# Patient Record
Sex: Male | Born: 1950 | Race: White | Hispanic: No | Marital: Married | Smoking: Never smoker
Health system: Southern US, Community
[De-identification: ages and names within clinical notes are randomized; demographics above are authoritative.]

## PROBLEM LIST (undated history)

## (undated) DIAGNOSIS — I959 Hypotension, unspecified: Secondary | ICD-10-CM

## (undated) DIAGNOSIS — T7840XA Allergy, unspecified, initial encounter: Secondary | ICD-10-CM

## (undated) DIAGNOSIS — G4730 Sleep apnea, unspecified: Secondary | ICD-10-CM

## (undated) DIAGNOSIS — E785 Hyperlipidemia, unspecified: Secondary | ICD-10-CM

## (undated) DIAGNOSIS — I480 Paroxysmal atrial fibrillation: Secondary | ICD-10-CM

## (undated) DIAGNOSIS — I2510 Atherosclerotic heart disease of native coronary artery without angina pectoris: Secondary | ICD-10-CM

## (undated) DIAGNOSIS — H269 Unspecified cataract: Secondary | ICD-10-CM

## (undated) HISTORY — PX: COLONOSCOPY: SHX174

## (undated) HISTORY — PX: RHINOPLASTY: SUR1284

## (undated) HISTORY — PX: INGUINAL HERNIA REPAIR: SUR1180

## (undated) HISTORY — DX: Hyperlipidemia, unspecified: E78.5

## (undated) HISTORY — DX: Hypotension, unspecified: I95.9

## (undated) HISTORY — PX: MOLE REMOVAL: SHX2046

## (undated) HISTORY — DX: Paroxysmal atrial fibrillation: I48.0

## (undated) HISTORY — PX: OTHER SURGICAL HISTORY: SHX169

## (undated) HISTORY — DX: Unspecified cataract: H26.9

## (undated) HISTORY — DX: Atherosclerotic heart disease of native coronary artery without angina pectoris: I25.10

## (undated) HISTORY — DX: Allergy, unspecified, initial encounter: T78.40XA

## (undated) HISTORY — PX: POLYPECTOMY: SHX149

---

## 2002-01-15 ENCOUNTER — Encounter: Payer: Self-pay | Admitting: Gastroenterology

## 2003-12-11 ENCOUNTER — Encounter: Admission: RE | Admit: 2003-12-11 | Discharge: 2003-12-11 | Payer: Self-pay | Admitting: General Surgery

## 2003-12-12 ENCOUNTER — Ambulatory Visit (HOSPITAL_BASED_OUTPATIENT_CLINIC_OR_DEPARTMENT_OTHER): Admission: RE | Admit: 2003-12-12 | Discharge: 2003-12-12 | Payer: Self-pay | Admitting: General Surgery

## 2003-12-12 ENCOUNTER — Ambulatory Visit (HOSPITAL_COMMUNITY): Admission: RE | Admit: 2003-12-12 | Discharge: 2003-12-12 | Payer: Self-pay | Admitting: General Surgery

## 2007-05-24 ENCOUNTER — Ambulatory Visit: Payer: Self-pay | Admitting: Gastroenterology

## 2007-06-07 ENCOUNTER — Encounter: Payer: Self-pay | Admitting: Gastroenterology

## 2007-06-07 ENCOUNTER — Ambulatory Visit: Payer: Self-pay | Admitting: Gastroenterology

## 2010-02-04 ENCOUNTER — Encounter: Payer: Self-pay | Admitting: Gastroenterology

## 2010-02-09 ENCOUNTER — Encounter (INDEPENDENT_AMBULATORY_CARE_PROVIDER_SITE_OTHER): Payer: Self-pay | Admitting: *Deleted

## 2010-02-09 ENCOUNTER — Encounter: Payer: Self-pay | Admitting: Gastroenterology

## 2010-04-06 ENCOUNTER — Ambulatory Visit: Payer: Self-pay | Admitting: Gastroenterology

## 2010-04-06 DIAGNOSIS — Z860100 Personal history of colon polyps, unspecified: Secondary | ICD-10-CM | POA: Insufficient documentation

## 2010-04-06 DIAGNOSIS — K921 Melena: Secondary | ICD-10-CM

## 2010-09-17 NOTE — Letter (Signed)
Summary: New Patient letter  [facility]  [address] [place], [place] [address]   Phone: [phone]  Fax: [phone]       02/09/2010 MRN: [id number]  Ronald Davis [address] [place], [place]  [address]  Dear Ronald Davis,  Welcome to the Gastroenterology Division at [facility].    You are scheduled to see Dr.  Russella Dar on 04-06-10 at 2:30p.m. on the 3rd floor at [facility], [address]  We ask that you try to arrive at our office 15 minutes prior to your appointment time to allow for check-in.  We would like you to complete the enclosed self-administered evaluation form prior to your visit and bring it with you on the day of your appointment.  We will review it with you.  Also, please bring a complete list of all your medications or, if you prefer, bring the medication bottles and we will list them.  Please bring your insurance card so that we may make a copy of it.  If your insurance requires a referral to see a specialist, please bring your referral form from your primary care physician.  Co-payments are due at the time of your visit and may be paid by cash, check or credit card.     Your office visit will consist of a consult with your physician (includes a physical exam), any laboratory testing he/she may order, scheduling of any necessary diagnostic testing (e.g. x-ray, ultrasound, CT-scan), and scheduling of a procedure (e.g. Endoscopy, Colonoscopy) if required.  Please allow enough time on your schedule to allow for any/all of these possibilities.    If you cannot keep your appointment, please call [phone] to cancel or reschedule prior to your appointment date.  This allows [place] the opportunity to schedule an appointment for another patient in need of care.  If you do not cancel or reschedule by 5 p.m. the business day prior to your appointment date, you will be charged a $50.00 late cancellation/no-show fee.    Thank you for choosing  [facility] for your medical needs.  We appreciate the opportunity to care for you.  Please visit [place] at our website  to learn more about our practice.                     Sincerely,                                                             The Gastroenterology Division

## 2010-09-17 NOTE — Assessment & Plan Note (Signed)
Summary: f/u--ch.   History of Present Illness Visit Type: follow up Primary GI MD: Elie Goody MD [facility] Primary Provider: Richmond Campbell, PA  Requesting Provider: Richmond Campbell, PA  Chief Complaint: Hemorrhoids, diarrhea, and rectal bleeding  History of Present Illness:   Mr. Vallone reports intermittent episodes of scant amounts of bright red blood per rectum. He attributes this to hemorrhoids.  He notes loose stools when he drinks more than a 2 cups of coffee per day.  He had a maternal grandmother with colon cancer in her 10s and he has had prior adenomatous colon polyps found in 05/2007.   GI Review of Systems      Denies abdominal pain, acid reflux, belching, bloating, chest pain, dysphagia with liquids, dysphagia with solids, heartburn, loss of appetite, nausea, vomiting, vomiting blood, weight loss, and  weight gain.      Reports diarrhea, hemorrhoids, and  rectal bleeding.     Denies anal fissure, black tarry stools, change in bowel habit, constipation, diverticulosis, fecal incontinence, heme positive stool, irritable bowel syndrome, jaundice, light color stool, liver problems, and  rectal pain.   Current Medications (verified): 1)  Clobetasol Propionate 0.05 % Crea (Clobetasol Propionate) .... Apply To Affected Area As Needed 2)  Fexofenadine Hcl 180 Mg Tabs (Fexofenadine Hcl) .... One Tablet By Mouth Once Daily As Needed Around Spring Time 3)  Ibuprofen 200 Mg Tabs (Ibuprofen) .... Two Tablets By Mouth As Needed For Headaches 4)  Aspirin 81 Mg Chew (Aspirin) .... Two Tablets By Mouth As Needed For Headaches If No  Ibuprofen On Hand  Allergies (verified): 1)  ! 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 Right inguinal hernia repair Bilateral ear  surgery Rhinoplasty Tonsillectomy  Family History: Reviewed history from 04/02/2010 and no changes required. Family History of Colon Cancer:Maternal Grandmother at onset age 63's. Family History of Heart Disease: Father  Social History: Chemist  Married No childern Patient has never smoked.  Alcohol Use - yes: 2 glass of wine daily  Daily Caffeine Use: 2 cups daily  Illicit Drug Use - no Drug Use:  no  Review of Systems       The patient complains of allergy/sinus.         The pertinent positives and negatives are noted as above and in the HPI. All other ROS were reviewed and were negative.   Vital Signs:  Patient profile:   60 year old male Height:      73 inches Weight:      251 pounds BMI:     33.24 BSA:     2.37 Pulse rate:   72 / minute Pulse rhythm:   regular BP sitting:   120 / 68  (left arm) Cuff size:   regular  Vitals Entered By: Ok Anis CMA (April 06, 2010 1:53 PM)   Physical Exam  General:  Well developed, well nourished, no acute distress. Head:  Normocephalic and atraumatic. Eyes:  PERRLA, no icterus. Ears:  Normal auditory acuity. Mouth:  No deformity or lesions, dentition normal. Lungs:  Clear throughout to auscultation. Heart:  Regular rate and rhythm; no murmurs, rubs,  or bruits. Abdomen:  Soft, nontender and nondistended. No masses, hepatosplenomegaly or hernias noted. Normal bowel sounds. Rectal:  deferred until time of colonoscopy.   Pulses:  Normal pulses noted. Extremities:  No clubbing, cyanosis, edema or  deformities noted. Neurologic:  Alert and  oriented x4;  grossly normal neurologically. Psych:  Alert and cooperative. Normal mood and affect.  Impression & Recommendations:  Problem # 1:  BLOOD IN STOOL (ICD-578.1) Intermittent small-volume hematochezia likely attributable to hemorrhoids. Given his prior history of adenomatous colon polyps and family history of colon cancer, plan to proceed with colonoscopy to exclude other  etiologies. The risks, benefits and alternatives to colonoscopy with possible biopsy, possible destruction of hemorrhoids and possible polypectomy were discussed with the patient and they consent to proceed. The procedure will be scheduled electively.  Problem # 2:  PERSONAL HISTORY OF COLONIC POLYPS (ICD-V12.72) Adenomatous colon polyps, initially diagnosed in October 2008. See problem #1.  Problem # 3:  FM HX MALIGNANT NEOPLASM GASTROINTESTINAL TRACT (ICD-V16.0) See problem #1.  Patient Instructions: 1)  Colonoscopy brochure given.  2)  Copy sent to : Richmond Campbell, PA 3)  The medication list was reviewed and reconciled.  All changed / newly prescribed medications were explained.  A complete medication list was provided to the patient / caregiver.  Prescriptions: MOVIPREP 100 GM  SOLR (PEG-KCL-NACL-NASULF-NA ASC-C) As per prep instructions.  #1 x 0   Entered by:   Christie Nottingham CMA (AAMA)   Authorized by:   Meryl Dare MD [facility]   Signed by:   Meryl Dare MD [facility] on 04/06/2010   Method used:   Electronically to        [facility] [place] [address]* (retail)       [address] [place] [address] [place], [place]  [address]       Ph: [phone]       Fax: [phone]   RxID:   [id number]

## 2010-09-17 NOTE — Letter (Signed)
[facility]   Imported By: Sherian Rein 04/09/2010 10:38:10  _____________________________________________________________________  External Attachment:    Type:   Image     Comment:   External Document

## 2010-09-17 NOTE — Procedures (Signed)
Summary: Colonoscopy   Colonoscopy  Procedure date:  01/15/2002  Findings:      Results: Hemorrhoids.     Results: Diverticulosis.       Pathology:  Hyperplastic polyp.     Location:  [facility].    Procedures Next Due Date:    Colonoscopy: 01/2005 Patient Name: Mohd., Derflinger MRN:  Procedure Procedures: Colonoscopy CPT: 04540.    with Hot Biopsy(s)CPT: Z451292.  Personnel: Endoscopist: Venita Lick. Russella Dar, MD, Clementeen Graham.  Referred By: Barbera Setters Artis Flock, MD.  Exam Location: Exam performed in Outpatient Clinic. Outpatient  Patient Consent: Procedure, Alternatives, Risks and Benefits discussed, consent obtained, from patient. Consent was obtained by the RN.  Indications  Increased Risk Screening: For family history of colorectal neoplasia, in  grandparent age at onset: 49s.  Comments: MGM with colon ca History  Pre-Exam Physical: Performed Jan 15, 2002. Cardio-pulmonary exam, Rectal exam, Abdominal exam, Neurological exam, Mental status exam WNL.  Exam Exam: Extent of exam reached: Cecum, extent intended: Cecum.  The cecum was identified by appendiceal orifice and IC valve. Colon retroflexion performed. ASA Classification: I. Tolerance: good.  Monitoring: Pulse and BP monitoring, Oximetry used. Supplemental O2 given.  Colon Prep Used Golytely for colon prep. Prep results: good.  Sedation Meds: Patient assessed and found to be appropriate for moderate (conscious) sedation. Fentanyl 100 mcg. given IV. Versed 5 mg. given IV.  Findings - DIVERTICULOSIS: Sigmoid Colon. Not bleeding. ICD9: Diverticulosis: 562.10.  NORMAL EXAM: Cecum to Descending Colon.  MULTIPLE POLYPS: Sigmoid Colon to Rectum. minimum size 2 mm, maximum size 4 mm. Procedure:  hot biopsy, removed, Polyp retrieved, 4 polyps Polyps sent to pathology. ICD9: Colon Polyps: 211.3.  HEMORRHOIDS: Internal. Size: Small. Not bleeding. Not thrombosed. ICD9: Hemorrhoids, Internal: 455.0.    Assessment  Diagnoses: 562.10: Diverticulosis.  211.3: Colon Polyps.  455.0: Hemorrhoids, Internal.   Events  Unplanned Interventions: No intervention was required.  Unplanned Events: There were no complications. Plans  Post Exam Instructions: No aspirin or non-steroidal containing medications: 2 weeks.  Medication Plan: Await pathology. Continue current medications.  Patient Education: Patient given standard instructions for: Polyps. Diverticulosis. Hemorrhoids.  Disposition: After procedure patient sent to recovery. After recovery patient sent home.  Scheduling/Referral: Colonoscopy, to [facility] T. Russella Dar, MD, [facility], if polyp(s) adenomatous, around Jan 15, 2005.  Primary Care Justyn Boyson, to Fayrene Fearing B. Kindl, MD,    This report was created from the original endoscopy report, which was reviewed and signed by the above listed endoscopist.    cc: Fayrene Fearing B. Artis Flock, MD

## 2010-09-17 NOTE — Letter (Signed)
Summary: [facility] Instructions  [facility]  [address] [place], [place] [address]   Phone: [phone]  Fax: [phone]       STRIDER VALLANCE    February 06, 1951    MRN: [id number]        Procedure Day /Date: Friday August 26th, 2011     Arrival Time: 9:00am     Procedure Time: 10:00am     Location of Procedure:                    _ x_  [facility] (4th Floor)                        PREPARATION FOR COLONOSCOPY WITH MOVIPREP   Starting 5 days prior to your procedure 04/05/10 do not eat nuts, seeds, popcorn, corn, beans, peas,  salads, or any raw vegetables.  Do not take any fiber supplements (e.g. Metamucil, Citrucel, and Benefiber).  THE DAY BEFORE YOUR PROCEDURE         DATE: 04/09/10  DAY: Thursday  1.  Drink clear liquids the entire day-NO SOLID FOOD  2.  Do not drink anything colored red or purple.  Avoid juices with pulp.  No orange juice.  3.  Drink at least 64 oz. (8 glasses) of fluid/clear liquids during the day to prevent dehydration and help the prep work efficiently.  CLEAR LIQUIDS INCLUDE: Water Jello Ice Popsicles Tea (sugar ok, no milk/cream) Powdered fruit flavored drinks Coffee (sugar ok, no milk/cream) Gatorade Juice: apple, white grape, white cranberry  Lemonade Clear bullion, consomm, broth Carbonated beverages (any kind) Strained chicken noodle soup Hard Candy                             4.  In the morning, mix first dose of MoviPrep solution:    Empty 1 Pouch A and 1 Pouch B into the disposable container    Add lukewarm drinking water to the top line of the container. Mix to dissolve    Refrigerate (mixed solution should be used within 24 hrs)  5.  Begin drinking the prep at 5:00 p.m. The MoviPrep container is divided by 4 marks.   Every 15 minutes drink the solution down to the next mark (approximately 8 oz) until the full liter is complete.   6.  Follow completed prep with 16 oz of clear liquid of your choice  (Nothing red or purple).  Continue to drink clear liquids until bedtime.  7.  Before going to bed, mix second dose of MoviPrep solution:    Empty 1 Pouch A and 1 Pouch B into the disposable container    Add lukewarm drinking water to the top line of the container. Mix to dissolve    Refrigerate  THE DAY OF YOUR PROCEDURE      DATE: 04/10/10 DAY: Friday  Beginning at 5:00 a.m. (5 hours before procedure):         1. Every 15 minutes, drink the solution down to the next mark (approx 8 oz) until the full liter is complete.  2. Follow completed prep with 16 oz. of clear liquid of your choice.    3. You may drink clear liquids until 8:00am  (2 HOURS BEFORE PROCEDURE).   MEDICATION INSTRUCTIONS  Unless otherwise instructed, you should take regular prescription medications with a small sip of water   as early as possible the morning of  your procedure.        OTHER INSTRUCTIONS  You will need a responsible adult at least 62 years of age to accompany you and drive you home.   This person must remain in the waiting room during your procedure.  Wear loose fitting clothing that is easily removed.  Leave jewelry and other valuables at home.  However, you may wish to bring a book to read or  an iPod/MP3 player to listen to music as you wait for your procedure to start.  Remove all body piercing jewelry and leave at home.  Total time from sign-in until discharge is approximately 2-3 hours.  You should go home directly after your procedure and rest.  You can resume normal activities the  day after your procedure.  The day of your procedure you should not:   Drive   Make legal decisions   Operate machinery   Drink alcohol   Return to work  You will receive specific instructions about eating, activities and medications before you leave.    The above instructions have been reviewed and explained to me by   Estill Bamberg.     I fully understand and can verbalize these  instructions _____________________________ Date _________

## 2010-09-17 NOTE — Procedures (Signed)
Summary: Colonoscopy and biopsy   Colonoscopy  Procedure date:  06/07/2007  Findings:      Results: Hemorrhoids.     Results: Diverticulosis.       Pathology:  Adenomatous polyp.        Location:  [facility].    Procedures Next Due Date:    Colonoscopy: 06/2012 Patient Name: Ronald Davis, Ronald Davis MRN:  Procedure Procedures: Colonoscopy CPT: 75916.    with biopsy. CPT: Q5068410.    with polypectomy. CPT: A3573898.  Personnel: Endoscopist: Venita Lick. Russella Dar, MD, Clementeen Graham.  Exam Location: Exam performed in Outpatient Clinic. Outpatient  Patient Consent: Procedure, Alternatives, Risks and Benefits discussed, consent obtained, from patient. Consent was obtained by the RN.  Indications  Increased Risk Screening: For family history of colorectal neoplasia, in  grandparent age at onset: 35s. Family History of Polyps.  Comments: MGM with colon cancer and all 6 of his siblings have colon polyps. History  Current Medications: Patient is not currently taking Coumadin.  Pre-Exam Physical: Performed Jun 07, 2007. Cardio-pulmonary exam, Rectal exam, HEENT exam , Abdominal exam, Mental status exam WNL.  Comments: Pt. history reviewed/updated, physical exam performed prior to initiation of sedation?Yes Exam Exam: Extent of exam reached: Cecum, extent intended: Cecum.  The cecum was identified by appendiceal orifice and IC valve. Time to Cecum: 00:02: 43. Time for Withdrawl: 00:13:08. Colon retroflexion performed. ASA Classification: II. Tolerance: excellent.  Monitoring: Pulse and BP monitoring, Oximetry used. Supplemental O2 given.  Colon Prep Used MoviPrep for colon prep. Prep results: good.  Sedation Meds: Patient assessed and found to be appropriate for moderate (conscious) sedation. Fentanyl 50 mcg. given IV. Versed 5 mg. given IV.  Findings POLYP: Descending Colon, Maximum size: 3 mm. sessile polyp. Procedure:  biopsy without cautery, removed, retrieved, Polyp sent to  pathology. ICD9: Colon Polyps: 211.3.  - DIVERTICULOSIS: Descending Colon to Sigmoid Colon. Not bleeding. ICD9: Diverticulosis: 562.10.  NORMAL EXAM: Cecum to Splenic Flexure.  POLYP: Rectum, Maximum size: 5 mm. sessile polyp. Procedure:  snare without cautery, removed, retrieved, sent to pathology. ICD9: Colon Polyps: 211.3.  HEMORRHOIDS: Internal. Size: Small. Not bleeding. Not thrombosed. ICD9: Hemorrhoids, Internal: 455.0.   Assessment  Diagnoses: 211.3: Colon Polyps.  562.10: Diverticulosis.  455.0: Hemorrhoids, Internal.   Events  Unplanned Interventions: No intervention was required.  Unplanned Events: There were no complications. Plans  Post Exam Instructions: Post sedation instructions given.  Medication Plan: Await pathology.  Patient Education: Patient given standard instructions for: Polyps. Diverticulosis. Hemorrhoids.  Disposition: After procedure patient sent to recovery. After recovery patient sent home.  Scheduling/Referral: Colonoscopy, to [facility] T. Russella Dar, MD, [facility], around Jun 06, 2012.    This report was created from the original endoscopy report, which was reviewed and signed by the above listed endoscopist.    cc: Fayrene Fearing B. Artis Flock, MD       SP-Surgical Pathology - STATUS: Final  .          By: Windy Kalata,      Perform Date: 22Oct08 00:01  Ordered By: Rica Records Date: 23Oct08 13:59  Facility: [facility]                               Department: CPATH  Service Report Text  [facility]   [address] [address]   [place], [place] [address]   Telephone [phone] or [phone] Fax [phone]    REPORT OF SURGICAL PATHOLOGY    Case #: [id number]   Patient Name: Ronald Davis, Ronald Davis.   Office Chart Number: [id number]    MRN: [id number]   Pathologist: Alden Server A. Delila Spence, MD   DOB/Age [birth year] (Age: 60) Gender: M   Date Taken: 06/07/2007   Date Received: 06/08/2007    FINAL DIAGNOSIS     ***MICROSCOPIC EXAMINATION AND DIAGNOSIS***    COLON, DESCENDING AND RECTUM, POLYPS: ONE ADENOMATOUS POLYP AND   TWO FRAGMENTS OF HYPERPLASTIC POLYPS.    mj   Date Reported: 06/09/2007 Alden Server A. Delila Spence, MD   *** Electronically Signed Out By [facility] ***    Clinical information   Family history Ca; screening. R/O adenoma (kv)    specimen(s) obtained   Colon, polyp(s), descending and rectal    Gross Description   Received in formalin are tan, soft tissue fragments that are   submitted in toto. Number: two.   Size: 0.5 cm. (SP:gt, 06/08/07)    gdt/   Additional Information  HL7 RESULT STATUS : F  External IF Update Timestamp : 2007-06-08:14:02:00.000000

## 2010-12-03 ENCOUNTER — Telehealth: Payer: Self-pay | Admitting: Gastroenterology

## 2010-12-03 NOTE — Telephone Encounter (Signed)
i spoke 

## 2010-12-03 NOTE — Telephone Encounter (Signed)
I spoke with the patient's wife and she is asking that the rectal bleeding be removed from her husbands chart.  I have advised the patient's wife this can't be removed, but I could send her a copy of the records to provide to the insurance company as Dr Ardell Isaacs office note says rectal bleeding is most likely hemorrhoidal. She will come pick up the letters today.

## 2010-12-14 ENCOUNTER — Ambulatory Visit (AMBULATORY_SURGERY_CENTER): Payer: BC Managed Care – PPO | Admitting: *Deleted

## 2010-12-14 VITALS — Ht 74.0 in | Wt 248.6 lb

## 2010-12-14 DIAGNOSIS — Z86010 Personal history of colonic polyps: Secondary | ICD-10-CM

## 2010-12-14 MED ORDER — PEG-KCL-NACL-NASULF-NA ASC-C 100 G PO SOLR: 1.0000 | Freq: Once | ORAL | Status: AC

## 2010-12-16 ENCOUNTER — Encounter: Payer: Self-pay | Admitting: Gastroenterology

## 2011-01-01 NOTE — Op Note (Signed)
NAME:  Ronald Davis, Ronald Davis NO.:  [id number]   MEDICAL RECORD NO.:  [id number]                   PATIENT TYPE:  AMB   LOCATION:  [facility]                                  FACILITY:  [facility]   PHYSICIAN:  Ollen Gross. Vernell Morgans, M.D.              DATE OF BIRTH:  [birth year]   DATE OF PROCEDURE:  12/16/2003  DATE OF DISCHARGE:  12/12/2003                                 OPERATIVE REPORT   PREOPERATIVE DIAGNOSIS:  Right inguinal hernia.   POSTOPERATIVE DIAGNOSIS:  Right inguinal hernia.   PROCEDURE:  Right direct inguinal hernia repair with mesh.   SURGEON:  Ollen Gross. Carolynne Edouard, M.D.   ANESTHESIA:  General via LMA.   DESCRIPTION OF PROCEDURE:  After informed consent was obtained, the patient  was brought to the operating room and placed in the supine position on the  operating table.  After the adequate induction of general anesthesia, the  patient's right abdomen and groin were prepped with Betadine and draped in  the usual sterile manner.  An incision was made from the edge of the pubic  tubercle on the right towards the anterior iliac spine for a distance of  about 5 cm.  This incision was carried down through the skin and  subcutaneous tissue sharply.  Using the Bovie electrocautery, a small  bridging vein was encountered which was clamped with hemostats, divided and  ligated with 3-0 silk ties.  The rest of the dissection was continued deeply  with the electrocautery until the fascia of the external oblique was  encountered.  The fascia of the external oblique was opened along its fibers  before safely exiting the external ring.  This was done sharply with the  Metzenbaum scissors.  Care was taken to avoid the ilioinguinal nerve  underneath.  Once this was accomplished, Weitlaner retractor was used to  retract the fascia laterally and medially.  The blunt dissection was carried  out around the cord structures to the edge of the pubic tubercle then using  blunt finger  dissection until the cord structures could be surrounded  between two fingers.  A 1/2 inch Penrose drain was then placed around the  cord structure for retraction purposes.  The cord was skeletonized bluntly  with the hemostat and DeBakey.  There was no indirect component identified.  There was some weakness of the floor of the inguinal canal consistent with a  direct inguinal hernia.  It was very broad based.  The hernia sac was  reduced back below the transversalis.  The transversalis layer was then  repaired using an interrupted 2-0 Vicryl stitches.  The floor of the canal  was then reinforced with a piece of 3 x 6 Prolene mesh.  Tails were cut in  the mesh laterally and wrapped around the cord structures.  The mesh was  then sewn inferiorly to the shelving edge of the inguinal ligament  using a  running 3-0 Prolene stitch.  The mesh was then sewed superiorly to the  muscular epidermotic strength layer of the transversalis using interrupted 2-  0 Prolene vertical mattress stitches.  The tails were wrapped around the  cord structures and anchored laterally with an interrupted 2-0 Prolene  stitch to the shelving edge of the inguinal ligament.  Once this was  accomplished, the mesh was in good position without any tension.  The repair  look very good.  The wound was then irrigated with copious amounts of  saline.  The external oblique fascia was then reapproximated using a running  2-0 Vicryl stitch.  The wound was infiltrated with 0.25% Marcaine, and the  subcutaneous fascia was closed with a running 3-0 Vicryl stitch.  The skin  was closed with a running 4-0 Monocryl subcuticular stitch.  Benzoin, Steri-Strips and sterile dressings were applied.  The patient  tolerated the procedure well.  At the end of the case, all needle, sponge  and instrument counts were correct.  The patient was awakened and taken to  the recovery room in stable condition.                                                Ollen Gross. Vernell Morgans, M.D.    PST/MEDQ  D:  12/16/2003  T:  12/16/2003  Job:  [id number]

## 2011-01-04 ENCOUNTER — Encounter: Payer: Self-pay | Admitting: Gastroenterology

## 2011-01-04 DIAGNOSIS — K5730 Diverticulosis of large intestine without perforation or abscess without bleeding: Secondary | ICD-10-CM | POA: Insufficient documentation

## 2011-01-05 ENCOUNTER — Encounter: Payer: Self-pay | Admitting: Gastroenterology

## 2011-01-05 ENCOUNTER — Ambulatory Visit (AMBULATORY_SURGERY_CENTER): Payer: BC Managed Care – PPO | Admitting: Gastroenterology

## 2011-01-05 VITALS — BP 147/88 | HR 80 | Temp 97.5°F | Resp 18 | Ht 74.0 in | Wt 248.0 lb

## 2011-01-05 DIAGNOSIS — D126 Benign neoplasm of colon, unspecified: Secondary | ICD-10-CM

## 2011-01-05 DIAGNOSIS — Z86010 Personal history of colonic polyps: Secondary | ICD-10-CM

## 2011-01-05 DIAGNOSIS — K921 Melena: Secondary | ICD-10-CM

## 2011-01-05 DIAGNOSIS — Z800 Family history of malignant neoplasm of digestive organs: Secondary | ICD-10-CM

## 2011-01-05 MED ORDER — SODIUM CHLORIDE 0.9 % IV SOLN: 500.0000 mL | INTRAVENOUS | Status: DC

## 2011-01-05 NOTE — Patient Instructions (Signed)
Polyps removed today. Diverticulosis warrants a high fiber diet with liberal fluid intake, just not today. Today follow blue and green discharge instruction sheets. Await pathology.  Dr. Russella Dar will mail you the results of pathology on polyps. Repeat colonoscopy in 5 years.

## 2011-01-06 ENCOUNTER — Telehealth: Payer: Self-pay | Admitting: *Deleted

## 2011-01-06 NOTE — Telephone Encounter (Signed)

## 2011-01-13 ENCOUNTER — Encounter: Payer: Self-pay | Admitting: Gastroenterology

## 2013-12-02 ENCOUNTER — Emergency Department (HOSPITAL_COMMUNITY)
Admission: EM | Admit: 2013-12-02 | Discharge: 2013-12-02 | Disposition: A | Discharge status: Home / Self Care | Payer: BC Managed Care – PPO | Attending: Emergency Medicine | Admitting: Emergency Medicine

## 2013-12-02 ENCOUNTER — Encounter (HOSPITAL_COMMUNITY): Payer: Self-pay | Admitting: Emergency Medicine

## 2013-12-02 ENCOUNTER — Emergency Department (HOSPITAL_COMMUNITY): Payer: BC Managed Care – PPO

## 2013-12-02 DIAGNOSIS — R079 Chest pain, unspecified: Secondary | ICD-10-CM | POA: Insufficient documentation

## 2013-12-02 DIAGNOSIS — Z880 Allergy status to penicillin: Secondary | ICD-10-CM | POA: Insufficient documentation

## 2013-12-02 DIAGNOSIS — Z79899 Other long term (current) drug therapy: Secondary | ICD-10-CM | POA: Insufficient documentation

## 2013-12-02 DIAGNOSIS — Z791 Long term (current) use of non-steroidal anti-inflammatories (NSAID): Secondary | ICD-10-CM | POA: Insufficient documentation

## 2013-12-02 DIAGNOSIS — R0602 Shortness of breath: Secondary | ICD-10-CM | POA: Insufficient documentation

## 2013-12-02 DIAGNOSIS — Z8669 Personal history of other diseases of the nervous system and sense organs: Secondary | ICD-10-CM | POA: Insufficient documentation

## 2013-12-02 DIAGNOSIS — IMO0002 Reserved for concepts with insufficient information to code with codable children: Secondary | ICD-10-CM | POA: Insufficient documentation

## 2013-12-02 DIAGNOSIS — Z7982 Long term (current) use of aspirin: Secondary | ICD-10-CM | POA: Insufficient documentation

## 2013-12-02 HISTORY — DX: Sleep apnea, unspecified: G47.30

## 2013-12-02 LAB — I-STAT TROPONIN, ED: Troponin i, poc: 0.01 ng/mL (ref 0.00–0.08)

## 2013-12-02 LAB — BASIC METABOLIC PANEL
BUN: 19 mg/dL (ref 6–23)
CHLORIDE: 104 meq/L (ref 96–112)
CO2: 21 meq/L (ref 19–32)
Calcium: 8.8 mg/dL (ref 8.4–10.5)
Creatinine, Ser: 1.05 mg/dL (ref 0.50–1.35)
GFR calc Af Amer: 85 mL/min — ABNORMAL LOW (ref >90)
GFR calc non Af Amer: 73 mL/min — ABNORMAL LOW (ref >90)
Glucose, Bld: 94 mg/dL (ref 70–99)
Potassium: 4.1 mEq/L (ref 3.7–5.3)
Sodium: 139 mEq/L (ref 137–147)

## 2013-12-02 LAB — CBC
HEMATOCRIT: 46.3 % (ref 39.0–52.0)
HEMOGLOBIN: 16.1 g/dL (ref 13.0–17.0)
MCH: 29.9 pg (ref 26.0–34.0)
MCHC: 34.8 g/dL (ref 30.0–36.0)
MCV: 86.1 fL (ref 78.0–100.0)
Platelets: 184 10*3/uL (ref 150–400)
RBC: 5.38 MIL/uL (ref 4.22–5.81)
RDW: 13.2 % (ref 11.5–15.5)
WBC: 4.8 10*3/uL (ref 4.0–10.5)

## 2013-12-02 LAB — PRO B NATRIURETIC PEPTIDE: Pro B Natriuretic peptide (BNP): 54.3 pg/mL (ref 0–125)

## 2013-12-02 LAB — TROPONIN I

## 2013-12-02 MED ORDER — ASPIRIN 81 MG PO CHEW
162.0000 mg | CHEWABLE_TABLET | Freq: Once | ORAL | Status: AC
  Administered 2013-12-02: 162 mg via ORAL
  Filled 2013-12-02: qty 2

## 2013-12-02 NOTE — ED Provider Notes (Signed)
CSN: [id number]     Arrival date & time 12/02/13  1108 History   First MD Initiated Contact with Patient 12/02/13 1119     Chief Complaint  Patient presents with  . Chest Pain     (Consider location/radiation/quality/duration/timing/severity/associated sxs/prior Treatment) Patient is a 63 y.o. male presenting with chest pain. The history is provided by the patient.  Chest Pain Pain location:  R chest Pain quality: dull and throbbing   Pain radiates to:  Does not radiate Pain radiates to the back: no   Pain severity:  Mild Onset quality:  Sudden Duration:  5 minutes Timing: once. Progression:  Resolved Chronicity:  New Context: at rest   Relieved by:  Nothing Worsened by:  Nothing tried Ineffective treatments:  None tried Associated symptoms: shortness of breath   Associated symptoms: no abdominal pain, no cough, no fever, no headache, no nausea, no numbness and not vomiting     Past Medical History  Diagnosis Date  . Allergy   . Sleep apnea    Past Surgical History  Procedure Laterality Date  . Colonoscopy    . Polypectomy    . Ears      ears pinned back  . Rhinoplasty       for sleep apnea  . Tonisllectomy    . Inguinal hernia repair      right   Family History  Problem Relation Age of Onset  . Colon cancer Maternal Grandmother    History  Substance Use Topics  . Smoking status: Never Smoker   . Smokeless tobacco: Not on file  . Alcohol Use: 5.2 oz/week    7 Glasses of wine, 2 Drinks containing 0.5 oz of alcohol per week    Review of Systems  Constitutional: Negative for fever.  HENT: Negative for drooling and rhinorrhea.   Eyes: Negative for pain.  Respiratory: Positive for shortness of breath. Negative for cough.   Cardiovascular: Positive for chest pain. Negative for leg swelling.  Gastrointestinal: Negative for nausea, vomiting, abdominal pain and diarrhea.  Genitourinary: Negative for dysuria and hematuria.  Musculoskeletal: Negative for gait  problem and neck pain.  Skin: Negative for color change.  Neurological: Negative for numbness and headaches.  Hematological: Negative for adenopathy.  Psychiatric/Behavioral: Negative for behavioral problems.  All other systems reviewed and are negative.     Allergies  Amoxicillin  Home Medications   Prior to Admission medications   Medication Sig Start Date End Date Taking? Authorizing Provider  aspirin 81 MG chewable tablet Chew 162 mg by mouth daily.    Yes Historical Provider, MD  clobetasol (TEMOVATE) 0.05 % ointment Apply 1 application topically as needed (eczema).    Yes Historical Provider, MD  diphenhydrAMINE (BENADRYL) 12.5 MG/5ML liquid Take 25 mg by mouth 4 (four) times daily as needed for sleep.   Yes Historical Provider, MD  fexofenadine (ALLEGRA) 180 MG tablet Take 180 mg by mouth daily.    Yes Historical Provider, MD  ibuprofen (ADVIL,MOTRIN) 200 MG tablet Take 400 mg by mouth every 6 (six) hours as needed for mild pain.   Yes Historical Provider, MD   BP 144/79  Pulse 60  Temp(Src) 98.3 F (36.8 C) (Oral)  Resp 16  SpO2 100% Physical Exam  Nursing note and vitals reviewed. Constitutional: He is oriented to person, place, and time. He appears well-developed and well-nourished.  HENT:  Head: Normocephalic and atraumatic.  Right Ear: External ear normal.  Left Ear: External ear normal.  Nose: Nose normal.  Mouth/Throat: Oropharynx is clear and moist. No oropharyngeal exudate.  Eyes: Conjunctivae and EOM are normal. Pupils are equal, round, and reactive to light.  Neck: Normal range of motion. Neck supple.  Cardiovascular: Normal rate, regular rhythm, normal heart sounds and intact distal pulses.  Exam reveals no gallop and no friction rub.   No murmur heard. Pulmonary/Chest: Effort normal and breath sounds normal. No respiratory distress. He has no wheezes.  Abdominal: Soft. Bowel sounds are normal. He exhibits no distension. There is no tenderness. There is  no rebound and no guarding.  Musculoskeletal: Normal range of motion. He exhibits no edema and no tenderness.  Neurological: He is alert and oriented to person, place, and time.  Skin: Skin is warm and dry.  Psychiatric: He has a normal mood and affect. His behavior is normal.    ED Course  Procedures (including critical care time) Labs Review Labs Reviewed  BASIC METABOLIC PANEL - Abnormal; Notable for the following:    GFR calc non Af Amer 73 (*)    GFR calc Af Amer 85 (*)    All other components within normal limits  CBC  PRO B NATRIURETIC PEPTIDE  TROPONIN I  Randolm Idol, ED    Imaging Review Dg Chest 2 View  12/02/2013   CLINICAL DATA:  Chest pain.  Nonsmoker.  EXAM: CHEST  2 VIEW  COMPARISON:  12/11/2003  FINDINGS: Midline trachea. Mild cardiomegaly. Mediastinal contours otherwise within normal limits. No pleural effusion or pneumothorax. Mild scarring at the left lung base.  IMPRESSION: Mild cardiomegaly, without acute disease.   Electronically Signed   By: Abigail Miyamoto M.D.   On: 12/02/2013 12:13     EKG Interpretation   Date/Time:  Sunday December 02 2013 11:16:17 EDT Ventricular Rate:  62 PR Interval:  175 QRS Duration: 96 QT Interval:  423 QTC Calculation: 429 R Axis:   58 Text Interpretation:  Sinus rhythm Baseline wander in lead(s) I III aVL  aVF No significant change since last tracing Confirmed by Jaquitta Dupriest  MD,  Cindel Daugherty ([id number]) on 12/02/2013 11:26:56 AM      MDM   Final diagnoses:  Chest pain    11:52 AM 63 y.o. male who presents with chest pain. He states that earlier in the morning he had been exerting himself pushing some heavy wheel barrels. Approximately 30 minutes after this activity he was sitting and resting watching TV when he had a throbbing sensation in the right parasternal area of his chest. He rates his pain as a 5/10 and states that lasts approximately 5 minutes. He did not have any shortness of breath. He decided to come to the hospital  and while moving some plastic flower pot helped of the front seat of his car he had recurrence of pain for approximately 30 seconds and some mild shortness of breath. He is afebrile and vital signs are unremarkable on exam here. He is currently asymptomatic. RF's for cardiac dis include FH. Atypical for cardiac and I'm not sure if the exertion was related as it was later that he had the pain. Pt took two 81mg  asa at home, will give 2 more. Will get screening labs.   3:35 PM: Mild cardiomegaly noted on CXR. This was discussed w/ the pt. Labs/imaging otherwise non-contrib. Pt remains asx. Delta trop neg. Low risk for MACE per HEART score.  I have discussed the diagnosis/risks/treatment options with the patient and believe the pt to be eligible for discharge home to follow-up with Cards next week.  We also discussed returning to the ED immediately if new or worsening sx occur. We discussed the sx which are most concerning (e.g., return of cp, sob) that necessitate immediate return. Medications administered to the patient during their visit and any new prescriptions provided to the patient are listed below.  Medications given during this visit Medications  aspirin chewable tablet 162 mg (162 mg Oral Given 12/02/13 1254)    New Prescriptions   No medications on file     Blanchard Kelch, MD 12/02/13 1548

## 2013-12-02 NOTE — Discharge Instructions (Signed)
Chest Pain (Nonspecific) °It is often hard to give a specific diagnosis for the cause of chest pain. There is always a chance that your pain could be related to something serious, such as a heart attack or a blood clot in the lungs. You need to follow up with your caregiver for further evaluation. °CAUSES  °· Heartburn. °· Pneumonia or bronchitis. °· Anxiety or stress. °· Inflammation around your heart (pericarditis) or lung (pleuritis or pleurisy). °· A blood clot in the lung. °· A collapsed lung (pneumothorax). It can develop suddenly on its own (spontaneous pneumothorax) or from injury (trauma) to the chest. °· Shingles infection (herpes zoster virus). °The chest wall is composed of bones, muscles, and cartilage. Any of these can be the source of the pain. °· The bones can be bruised by injury. °· The muscles or cartilage can be strained by coughing or overwork. °· The cartilage can be affected by inflammation and become sore (costochondritis). °DIAGNOSIS  °Lab tests or other studies, such as X-rays, electrocardiography, stress testing, or cardiac imaging, may be needed to find the cause of your pain.  °TREATMENT  °· Treatment depends on what may be causing your chest pain. Treatment may include: °· Acid blockers for heartburn. °· Anti-inflammatory medicine. °· Pain medicine for inflammatory conditions. °· Antibiotics if an infection is present. °· You may be advised to change lifestyle habits. This includes stopping smoking and avoiding alcohol, caffeine, and chocolate. °· You may be advised to keep your head raised (elevated) when sleeping. This reduces the chance of acid going backward from your stomach into your esophagus. °· Most of the time, nonspecific chest pain will improve within 2 to 3 days with rest and mild pain medicine. °HOME CARE INSTRUCTIONS  °· If antibiotics were prescribed, take your antibiotics as directed. Finish them even if you start to feel better. °· For the next few days, avoid physical  activities that bring on chest pain. Continue physical activities as directed. °· Do not smoke. °· Avoid drinking alcohol. °· Only take over-the-counter or prescription medicine for pain, discomfort, or fever as directed by your caregiver. °· Follow your caregiver's suggestions for further testing if your chest pain does not go away. °· Keep any follow-up appointments you made. If you do not go to an appointment, you could develop lasting (chronic) problems with pain. If there is any problem keeping an appointment, you must call to reschedule. °SEEK MEDICAL CARE IF:  °· You think you are having problems from the medicine you are taking. Read your medicine instructions carefully. °· Your chest pain does not go away, even after treatment. °· You develop a rash with blisters on your chest. °SEEK IMMEDIATE MEDICAL CARE IF:  °· You have increased chest pain or pain that spreads to your arm, neck, jaw, back, or abdomen. °· You develop shortness of breath, an increasing cough, or you are coughing up blood. °· You have severe back or abdominal pain, feel nauseous, or vomit. °· You develop severe weakness, fainting, or chills. °· You have a fever. °THIS IS AN EMERGENCY. Do not wait to see if the pain will go away. Get medical help at once. Call your local emergency services (911 in [place] Do not drive yourself to the hospital. °MAKE SURE YOU:  °· Understand these instructions. °· Will watch your condition. °· Will get help right away if you are not doing well or get worse. °Document Released: 05/12/2005 Document Revised: 10/25/2011 Document Reviewed: 03/07/2008 °[employer]® Patient Information ©2014 [employer]   [employer]. ° °

## 2013-12-02 NOTE — ED Notes (Signed)
Pt left prior to signing d/c instructions on e-pad.

## 2013-12-02 NOTE — ED Notes (Signed)
Patient reports that he had 5 minutes of chest pain. The patient reports that he is pain free at this time. However has 2 episiodes of pain earlier today with some SOB noted on the 2nd time.

## 2014-01-03 ENCOUNTER — Ambulatory Visit (INDEPENDENT_AMBULATORY_CARE_PROVIDER_SITE_OTHER)
Admission: RE | Admit: 2014-01-03 | Discharge: 2014-01-03 | Disposition: A | Discharge status: Home / Self Care | Payer: Self-pay | Source: Ambulatory Visit | Attending: Cardiovascular Disease | Admitting: Cardiovascular Disease

## 2014-01-03 ENCOUNTER — Encounter: Payer: Self-pay | Admitting: Cardiovascular Disease

## 2014-01-03 ENCOUNTER — Ambulatory Visit (INDEPENDENT_AMBULATORY_CARE_PROVIDER_SITE_OTHER): Payer: BC Managed Care – PPO | Admitting: Cardiovascular Disease

## 2014-01-03 VITALS — BP 143/61 | HR 64 | Ht 74.0 in | Wt 243.0 lb

## 2014-01-03 DIAGNOSIS — R079 Chest pain, unspecified: Secondary | ICD-10-CM

## 2014-01-03 NOTE — Assessment & Plan Note (Signed)
Atypical musculoskeletal pain resolved.  Favor just calcium score to r/o atherosclerotic disease.  If score greater than 100 would qualify for stress echo.

## 2014-01-03 NOTE — Patient Instructions (Signed)
Your physician recommends that you schedule a follow-up appointment in:  AS NEEDED  Your physician recommends that you continue on your current medications as directed. Please refer to the Current Medication list given to you today.  CALCIUM  SCORE OUT OF  POCKET   $150.00

## 2014-01-03 NOTE — Progress Notes (Signed)
Patient ID: Ronald Davis, male   DOB: 10-Jul-1951, 63 y.o.   MRN: [id number]   63 yo referred by [facility].  For chest pain Seen in ER 12/02/13   He states that earlier that  morning he had been exerting himself pushing some heavy wheel barrels. Approximately 30 minutes after this activity he was sitting and resting watching TV when he had a throbbing sensation in the right parasternal area of his chest. He rates his pain as a 5/10 and states that lasts approximately 5 minutes. He did not have any shortness of breath. He decided to come to the hospital and while moving some plastic flower pot helped of the front seat of his car he had recurrence of pain for approximately 30 seconds and some mild shortness of breath.RF's for cardiac dis include FH. Atypical for cardiac and I'm not sure if the exertion was related as it was later that he had the pain. Pt took two 81mg  asa at home, will give 2 more.   In ER no arrhythmia on telemetry  ECG and CXR normal labs ok with negative troponin       ROS: Denies fever, malais, weight loss, blurry vision, decreased visual acuity, cough, sputum, SOB, hemoptysis, pleuritic pain, palpitaitons, heartburn, abdominal pain, melena, lower extremity edema, claudication, or rash.  All other systems reviewed and negative   General: Affect appropriate Healthy:  appears stated age 63: normal Neck supple with no adenopathy JVP normal no bruits no thyromegaly Lungs clear with no wheezing and good diaphragmatic motion Heart:  S1/S2 no murmur,rub, gallop or click PMI normal Abdomen: benighn, BS positve, no tenderness, no AAA no bruit.  No HSM or HJR Distal pulses intact with no bruits No edema Neuro non-focal Skin warm and dry No muscular weakness  Medications Current Outpatient Prescriptions  Medication Sig Dispense Refill  . aspirin 81 MG chewable tablet Chew 81 mg by mouth as needed.       . cetirizine (ZYRTEC) 10 MG tablet Take 5 mg  by mouth as needed.       . clobetasol (TEMOVATE) 0.05 % ointment Apply 1 application topically as needed (eczema).       . diphenhydrAMINE (BENADRYL) 12.5 MG/5ML liquid Take 25 mg by mouth 4 (four) times daily as needed for sleep.      Marland Kitchen ibuprofen (ADVIL,MOTRIN) 200 MG tablet Take 400 mg by mouth every 6 (six) hours as needed for mild pain.       No current facility-administered medications for this visit.    Allergies Amoxicillin  Family History: Family History  Problem Relation Age of Onset  . Colon cancer Maternal Grandmother     Social History: History   Social History  . Marital Status: Married    Spouse Name: N/A    Number of Children: N/A  . Years of Education: N/A   Occupational History  . Not on file.   Social History Main Topics  . Smoking status: Never Smoker   . Smokeless tobacco: Not on file  . Alcohol Use: 5.2 oz/week    7 Glasses of wine, 2 Drinks containing 0.5 oz of alcohol per week  . Drug Use: Not on file  . Sexual Activity: Not on file   Other Topics Concern  . Not on file   Social History Narrative  . No narrative on file    Electrocardiogram:  Assessment and Plan

## 2014-01-09 ENCOUNTER — Telehealth: Payer: Self-pay | Admitting: *Deleted

## 2014-01-09 DIAGNOSIS — R931 Abnormal findings on diagnostic imaging of heart and coronary circulation: Secondary | ICD-10-CM

## 2014-01-09 NOTE — Telephone Encounter (Signed)
Message copied by Richmond Campbell on Wed Jan 09, 2014 11:53 AM ------      Message from: Josue Hector      Created: Fri Jan 04, 2014  3:35 PM       Calcium score very high over 300  F/U lexiscan myvoue            ----- Message -----         From: Rad Results In Interface         Sent: 01/03/2014  10:57 AM           To: Josue Hector, MD                   ------

## 2014-01-09 NOTE — Telephone Encounter (Signed)
PT'S  WIFE  AWARE  SPOKE WITH  PT   ALSO APPEARS  LEXISCAN  HAS BEEN SCHEDULED FOR  June  9 AT  9:15  AM .Adonis Housekeeper

## 2014-01-09 NOTE — Telephone Encounter (Signed)
Follow up    Pt's wife called saying she saw a missed call on her tv screen.    Please give her call back about an appt that is beeing set up for pt.

## 2014-01-09 NOTE — Telephone Encounter (Signed)
PT  NOTIFIED ./CY 

## 2014-01-22 ENCOUNTER — Ambulatory Visit (HOSPITAL_COMMUNITY): Payer: BC Managed Care – PPO | Attending: Cardiovascular Disease | Admitting: Radiology

## 2014-01-22 VITALS — BP 164/90 | Ht 74.0 in | Wt 239.0 lb

## 2014-01-22 DIAGNOSIS — R079 Chest pain, unspecified: Secondary | ICD-10-CM

## 2014-01-22 DIAGNOSIS — Z8249 Family history of ischemic heart disease and other diseases of the circulatory system: Secondary | ICD-10-CM | POA: Insufficient documentation

## 2014-01-22 DIAGNOSIS — R0602 Shortness of breath: Secondary | ICD-10-CM | POA: Insufficient documentation

## 2014-01-22 DIAGNOSIS — R931 Abnormal findings on diagnostic imaging of heart and coronary circulation: Secondary | ICD-10-CM

## 2014-01-22 DIAGNOSIS — I4949 Other premature depolarization: Secondary | ICD-10-CM

## 2014-01-22 DIAGNOSIS — R42 Dizziness and giddiness: Secondary | ICD-10-CM | POA: Insufficient documentation

## 2014-01-22 MED ORDER — REGADENOSON 0.4 MG/5ML IV SOLN
0.4000 mg | Freq: Once | INTRAVENOUS | Status: AC
Start: 2014-01-22 — End: 2014-01-22
  Administered 2014-01-22: 0.4 mg via INTRAVENOUS

## 2014-01-22 MED ORDER — TECHNETIUM TC 99M SESTAMIBI GENERIC - CARDIOLITE
10.8000 | Freq: Once | INTRAVENOUS | Status: AC | PRN
  Administered 2014-01-22: 11 via INTRAVENOUS

## 2014-01-22 MED ORDER — TECHNETIUM TC 99M SESTAMIBI GENERIC - CARDIOLITE
33.0000 | Freq: Once | INTRAVENOUS | Status: AC | PRN
  Administered 2014-01-22: 33 via INTRAVENOUS

## 2014-01-22 NOTE — Progress Notes (Signed)
[facility] [address] [place], [place] [address] [phone]    Cardiology Nuclear Med Study  Ronald Davis is a 63 y.o. male     MRN : [id number]     DOB: [birth year]  Procedure Date: 01/22/2014  Nuclear Med Background Indication for Stress Test:  Evaluation for Ischemia, Patient seen in hospital on 11-2013 for Chest Pain, enzymes negative. History:  5/15 CT: Increase CA score Cardiac Risk Factors: Family History - CAD  Symptoms:  Chest Pain Rest and with Exertion (last date of chest discomfort 4 days ago), Dizziness and SOB   Nuclear Pre-Procedure Caffeine/Decaff Intake:  None> 12 hrs NPO After: 8:45pm   Lungs:  clear O2 Sat: 96% on room air. IV 0.9% NS with Angio Cath:  20g  IV Site: R Antecubital x 1, tolerated well IV Started by:  Irven Baltimore, RN  Chest Size (in):  44 Cup Size: n/a  Height: 6\' 2"  (1.88 m)  Weight:  239 lb (108.41 kg)  BMI:  Body mass index is 30.67 kg/(m^2). Tech Comments:  N/A    Nuclear Med Study 1 or 2 day study: 1 day  Stress Test Type:  Treadmill/Lexiscan  Reading MD: N/A  Order Authorizing Provider:  Jenkins Rouge, MD  Resting Radionuclide: Technetium 62m Sestamibi  Resting Radionuclide Dose: 11.0 mCi   Stress Radionuclide:  Technetium 22m Sestamibi  Stress Radionuclide Dose: 33.0 mCi           Stress Protocol Rest HR: 53 Stress HR: 90  Rest BP: 164/90 Stress BP: 179/92  Exercise Time (min): 2:00 METS: 2.20   Predicted Max HR: 156 bpm % Max HR: 57.69 bpm Rate Pressure Product: 16110   Dose of Adenosine (mg):  n/a Dose of Lexiscan: 0.4 mg  Dose of Atropine (mg): n/a Dose of Dobutamine: n/a mcg/kg/min (at max HR)  Stress Test Technologist: Matilde Haymaker, RN  Nuclear Technologist:  Charlton Amor, CNMT     Rest Procedure:  Myocardial perfusion imaging was performed at rest 45 minutes following the intravenous administration of Technetium 66m Sestamibi. Rest ECG: NSR - Normal EKG  Stress Procedure:   The patient received IV Lexiscan 0.4 mg over 15-seconds with concurrent low level exercise and then Technetium 29m Sestamibi was injected at 30-seconds while the patient continued walking one more minute. Patient had chest tightness 4/10 with infusion but resolved in recovery. Quantitative spect images were obtained after a 45-minute delay. Stress ECG: No significant change from baseline ECG  QPS Raw Data Images:  Normal; no motion artifact; normal heart/lung ratio. Stress Images:  Normal homogeneous uptake in all areas of the myocardium. Rest Images:  Normal homogeneous uptake in all areas of the myocardium. Subtraction (SDS):  No evidence of ischemia. Transient Ischemic Dilatation (Normal <1.22):  1.00 Lung/Heart Ratio (Normal <0.45):  0.25  Quantitative Gated Spect Images QGS EDV:  143 ml QGS ESV:  66 ml  Impression Exercise Capacity:  Lexiscan with low level exercise. BP Response:  Normal blood pressure response. Clinical Symptoms:  Mild chest pain/dyspnea. ECG Impression:  No significant ST segment change suggestive of ischemia. Comparison with Prior Nuclear Study: No previous nuclear study performed  Overall Impression:  Normal stress nuclear study.  LV Ejection Fraction: 54%.  LV Wall Motion:  NL LV Function; NL Wall Motion   Darlin Coco MD

## 2014-12-23 ENCOUNTER — Encounter: Payer: Self-pay | Admitting: Gastroenterology

## 2015-10-23 ENCOUNTER — Encounter: Payer: Self-pay | Admitting: Gastroenterology

## 2015-10-27 IMAGING — CT CT HEART SCORING
1 of 3 series · 10 of 20 positions shown, 13 images · non-contrast
Comparison: Chest radiographs dated 12/02/2013

EXAM:
OVER-READ INTERPRETATION  CT CHEST

The following report is an over-read performed by radiologist Dr.
over-read does not include interpretation of cardiac or coronary
anatomy or pathology. The coronary calcium score interpretation by
the cardiologist is attached.

[Series 6: st thins for reformat · axial · 0.71mm/px · z∈[-192,-93]mm · 10 of 123 slices shown, 13 images]
[im 12/123  vessel]
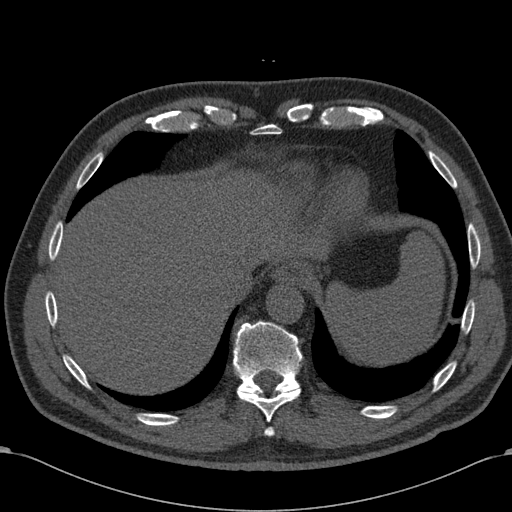
[im 12/123  lung]
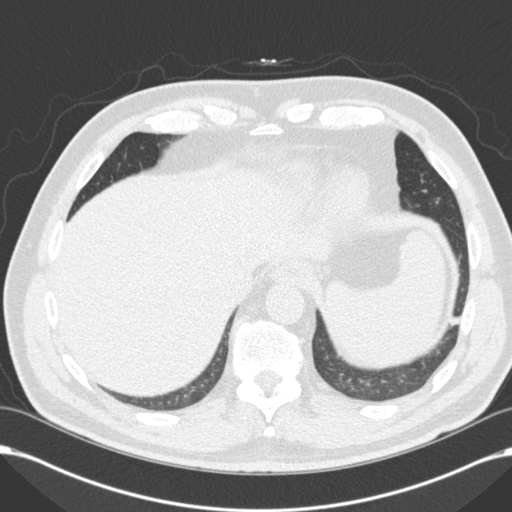
[im 23/123  vessel]
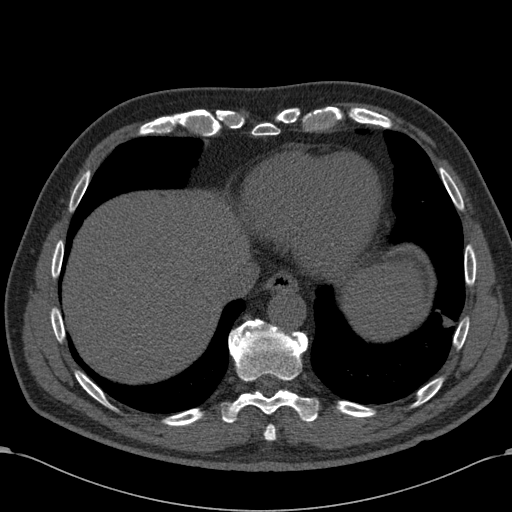
[im 34/123  vessel]
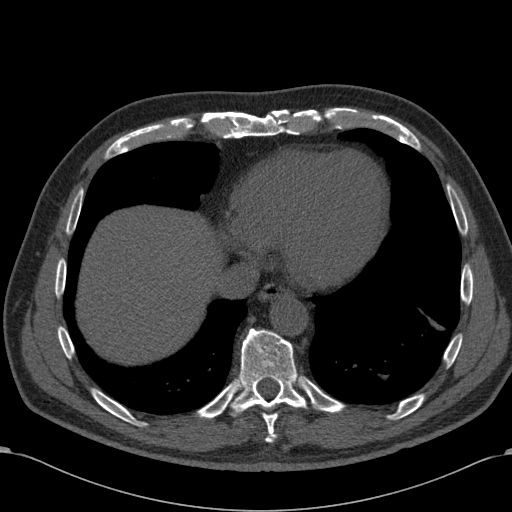
[im 45/123  vessel]
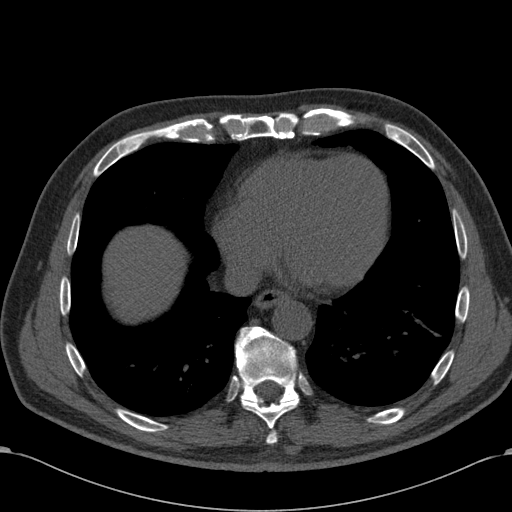
[im 56/123  vessel]
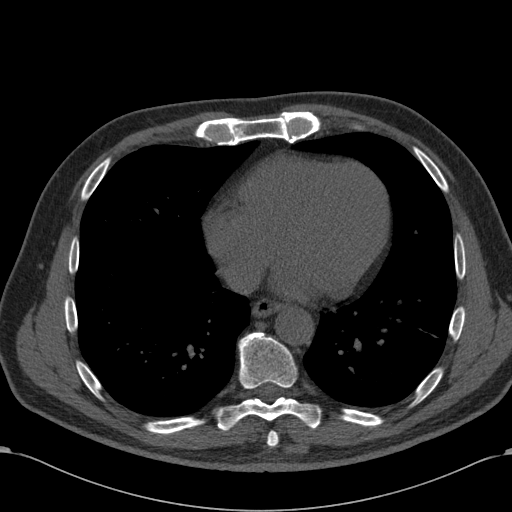
[im 56/123  lung]
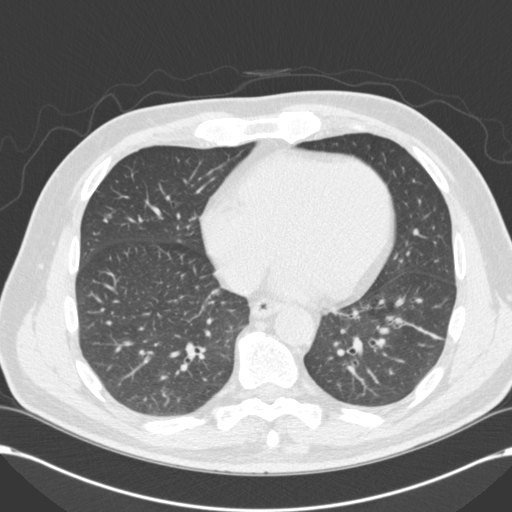
[im 67/123  vessel]
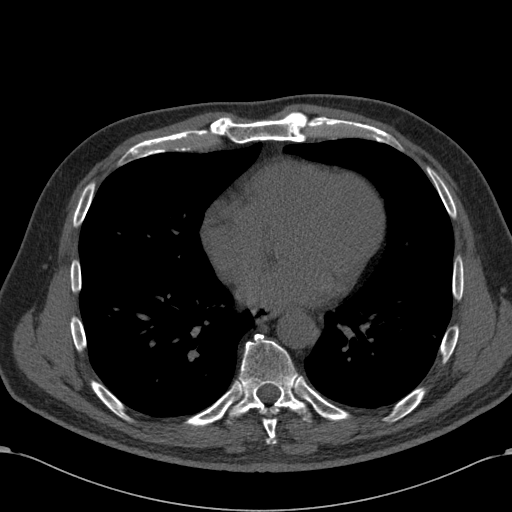
[im 78/123  vessel]
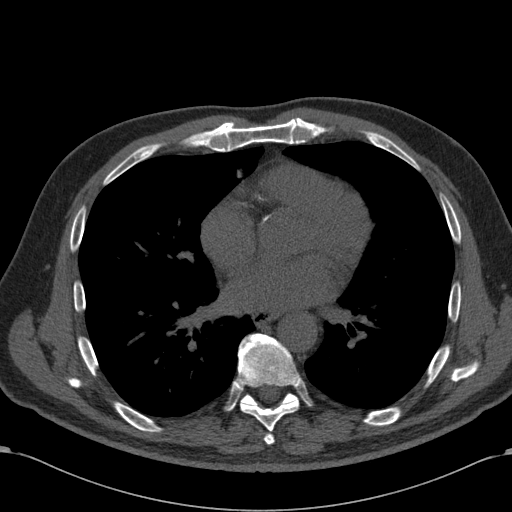
[im 89/123  vessel]
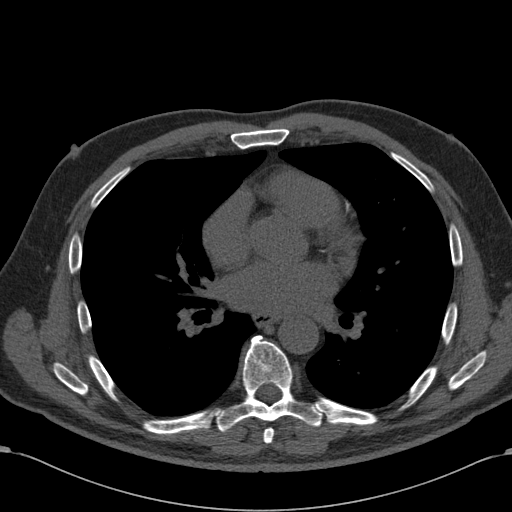
[im 100/123  vessel]
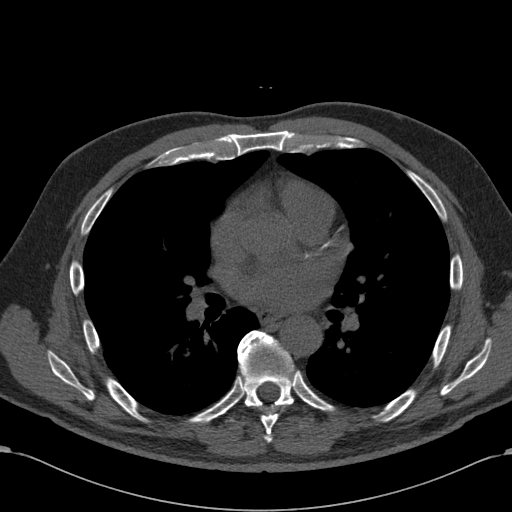
[im 100/123  lung]
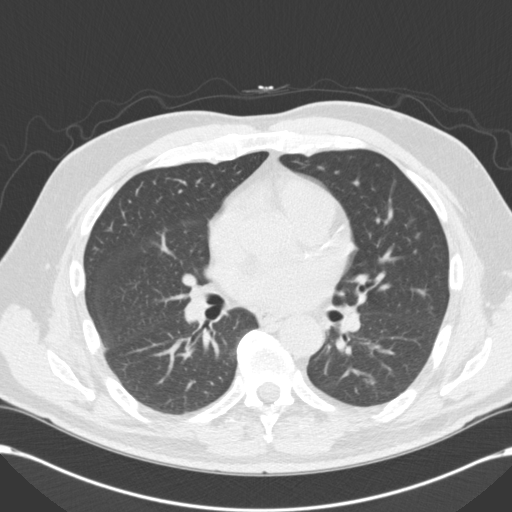
[im 111/123  vessel]
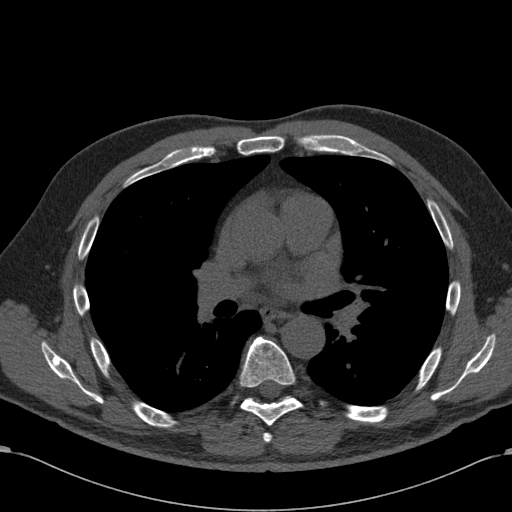

[10 of 20 positions shown; findings below may reference images not displayed]

FINDINGS: Evaluation of lung parenchyma is constrained by respiratory motion.

11 x 8 mm nodule along the medial/inferior aspect of the right upper
lobe, adjacent to the minor fissure (series 4/ image 15), poorly
visualized but notable for calcification (series 5/image 15),
favored to reflect a partially calcified granuloma.

Additional 4 mm nodule at the left lung base (series 4/ image 32).
Adjacent nodular scarring in the left lower lobe.

No suspicious thoracic lymphadenopathy in the visualized chest.

Degenerative changes of the visualized thoracic spine.
IMPRESSION: 11 mm partially calcified granuloma along the inferior aspect of the
right upper lobe.

Additional 4 mm noncalcified nodule at the left lung base,
indeterminate but statistically likely benign.

If this patient is high risk for primary bronchogenic neoplasm, a
single follow-up CT chest is suggested in 12 months. If low risk, no
dedicated follow-up imaging is required.

This recommendation follows the consensus statement: Guidelines for
Management of Small Pulmonary Nodules Detected on CT Scans: A
Statement from the [HOSPITAL] as published in Radiology

## 2016-02-25 ENCOUNTER — Ambulatory Visit (AMBULATORY_SURGERY_CENTER): Payer: Self-pay | Admitting: *Deleted

## 2016-02-25 VITALS — Ht 73.0 in | Wt 234.8 lb

## 2016-02-25 DIAGNOSIS — Z86010 Personal history of colonic polyps: Secondary | ICD-10-CM

## 2016-02-25 MED ORDER — NA SULFATE-K SULFATE-MG SULF 17.5-3.13-1.6 GM/177ML PO SOLN: 1.0000 | Freq: Once | ORAL | Status: DC

## 2016-02-25 NOTE — Progress Notes (Signed)
Denies allergies to eggs or soy products. Denies complications with sedation or anesthesia. Denies O2 use. Denies use of diet or weight loss medications.  Emmi instructions given for colonoscopy.  

## 2016-03-10 ENCOUNTER — Ambulatory Visit (AMBULATORY_SURGERY_CENTER): Payer: Medicare Other | Admitting: Gastroenterology

## 2016-03-10 ENCOUNTER — Encounter: Payer: Self-pay | Admitting: Gastroenterology

## 2016-03-10 VITALS — BP 116/68 | HR 52 | Temp 97.7°F | Resp 15 | Ht 73.0 in | Wt 234.0 lb

## 2016-03-10 DIAGNOSIS — D125 Benign neoplasm of sigmoid colon: Secondary | ICD-10-CM

## 2016-03-10 DIAGNOSIS — K621 Rectal polyp: Secondary | ICD-10-CM | POA: Diagnosis not present

## 2016-03-10 DIAGNOSIS — D128 Benign neoplasm of rectum: Secondary | ICD-10-CM | POA: Diagnosis not present

## 2016-03-10 DIAGNOSIS — Z86010 Personal history of colonic polyps: Secondary | ICD-10-CM

## 2016-03-10 DIAGNOSIS — Z800 Family history of malignant neoplasm of digestive organs: Secondary | ICD-10-CM

## 2016-03-10 DIAGNOSIS — D129 Benign neoplasm of anus and anal canal: Secondary | ICD-10-CM

## 2016-03-10 NOTE — Patient Instructions (Signed)
YOU HAD AN ENDOSCOPIC PROCEDURE TODAY AT THE [facility]:   Refer to the procedure report that was given to you for any specific questions about what was found during the examination.  If the procedure report does not answer your questions, please call your gastroenterologist to clarify.  If you requested that your care partner not be given the details of your procedure findings, then the procedure report has been included in a sealed envelope for you to review at your convenience later.  YOU SHOULD EXPECT: Some feelings of bloating in the abdomen. Passage of more gas than usual.  Walking can help get rid of the air that was put into your GI tract during the procedure and reduce the bloating. If you had a lower endoscopy (such as a colonoscopy or flexible sigmoidoscopy) you may notice spotting of blood in your stool or on the toilet paper. If you underwent a bowel prep for your procedure, you may not have a normal bowel movement for a few days.  Please Note:  You might notice some irritation and congestion in your nose or some drainage.  This is from the oxygen used during your procedure.  There is no need for concern and it should clear up in a day or so.  SYMPTOMS TO REPORT IMMEDIATELY:   Following lower endoscopy (colonoscopy or flexible sigmoidoscopy):  Excessive amounts of blood in the stool  Significant tenderness or worsening of abdominal pains  Swelling of the abdomen that is new, acute  Fever of 100F or higher  For urgent or emergent issues, a gastroenterologist can be reached at any hour by calling [phone].   DIET: Your first meal following the procedure should be a small meal and then it is ok to progress to your normal diet. Heavy or fried foods are harder to digest and may make you feel nauseous or bloated.  Likewise, meals heavy in dairy and vegetables can increase bloating.  Drink plenty of fluids but you should avoid alcoholic beverages for 24  hours.  ACTIVITY:  You should plan to take it easy for the rest of today and you should NOT DRIVE or use heavy machinery until tomorrow (because of the sedation medicines used during the test).    FOLLOW UP: Our staff will call the number listed on your records the next business day following your procedure to check on you and address any questions or concerns that you may have regarding the information given to you following your procedure. If we do not reach you, we will leave a message.  However, if you are feeling well and you are not experiencing any problems, there is no need to return our call.  We will assume that you have returned to your regular daily activities without incident.  If any biopsies were taken you will be contacted by phone or by letter within the next 1-3 weeks.  Please call us at [phone] if you have not heard about the biopsies in 3 weeks.    SIGNATURES/CONFIDENTIALITY: You and/or your care partner have signed paperwork which will be entered into your electronic medical record.  These signatures attest to the fact that that the information above on your After Visit Summary has been reviewed and is understood.  Full responsibility of the confidentiality of this discharge information lies with you and/or your care-partner.    Handouts were given to your care partner on polyps, diverticulosis, and a high fiber diet with liberal fluid intake. You may resume your   current medications today. Await biopsy results. Please call if any questions or concerns.   

## 2016-03-10 NOTE — Progress Notes (Signed)
No problems noted in the recovery room. maw 

## 2016-03-10 NOTE — Progress Notes (Signed)
Called to room to assist during endoscopic procedure.  Patient ID and intended procedure confirmed with present staff. Received instructions for my participation in the procedure from the performing physician.  

## 2016-03-10 NOTE — Progress Notes (Signed)
To recovery, report to Willis, RN, VSS 

## 2016-03-10 NOTE — Op Note (Signed)
[place] Patient Name: Aristidis Adler Procedure Date: 03/10/2016 9:23 AM MRN: [id number] Endoscopist: Ladene Artist , MD Age: 65 Referring MD:  Date of Birth: 06/19/1951 Gender: Male Account #: [id number] Procedure:                Colonoscopy Indications:              Surveillance: Personal history of adenomatous                            polyps on last colonoscopy > 5 years ago Medicines:                Monitored Anesthesia Care Procedure:                Pre-Anesthesia Assessment:                           - Prior to the procedure, a History and Physical                            was performed, and patient medications and                            allergies were reviewed. The patient's tolerance of                            previous anesthesia was also reviewed. The risks                            and benefits of the procedure and the sedation                            options and risks were discussed with the patient.                            All questions were answered, and informed consent                            was obtained. Prior Anticoagulants: The patient has                            taken no previous anticoagulant or antiplatelet                            agents. ASA Grade Assessment: II - A patient with                            mild systemic disease. After reviewing the risks                            and benefits, the patient was deemed in                            satisfactory condition to undergo the procedure.  After obtaining informed consent, the colonoscope                            was passed under direct vision. Throughout the                            procedure, the patient's blood pressure, pulse, and                            oxygen saturations were monitored continuously. The                            Model PCF-H190L [id number]) scope was introduced                            through the anus and  advanced to the the cecum,                            identified by appendiceal orifice and ileocecal                            valve. The ileocecal valve, appendiceal orifice,                            and rectum were photographed. The quality of the                            bowel preparation was adequate. The colonoscopy was                            performed without difficulty. The patient tolerated                            the procedure well. Scope In: 9:38:06 AM Scope Out: 9:54:58 AM Scope Withdrawal Time: 0 hours 14 minutes 28 seconds  Total Procedure Duration: 0 hours 16 minutes 52 seconds  Findings:                 A 8 mm polyp was found in the rectum. The polyp was                            semi-pedunculated. The polyp was removed with a                            cold snare. Resection and retrieval were complete.                           Two sessile polyps were found in the sigmoid colon.                            The polyps were 4 to 5 mm in size. These polyps  were removed with a cold biopsy forceps. Resection                            and retrieval were complete.                           Multiple medium-mouthed diverticula were found in                            the sigmoid colon and descending colon. There was                            narrowing of the colon in association with the                            diverticular opening. There was evidence of                            diverticular spasm. There was no evidence of                            diverticular bleeding.                           The exam was otherwise normal throughout the                            examined colon.                           The retroflexed view of the distal rectum and anal                            verge was normal and showed no anal or rectal                            abnormalities. Complications:            No immediate complications. Estimated  blood loss:                            None. Estimated Blood Loss:     Estimated blood loss: none. Impression:               - One 8 mm polyp in the rectum, removed with a cold                            snare. Resected and retrieved.                           - Two 4 to 5 mm polyps in the sigmoid colon,                            removed with a cold biopsy forceps. Resected and  retrieved.                           - Moderate diverticulosis in the sigmoid colon and                            in the descending colon.                           - The distal rectum and anal verge are normal on                            retroflexion view. Recommendation:           - Repeat colonoscopy in 5 years for surveillance                            with a more extensive bowel.                           - Patient has a contact number available for                            emergencies. The signs and symptoms of potential                            delayed complications were discussed with the                            patient. Return to normal activities tomorrow.                            Written discharge instructions were provided to the                            patient.                           - High fiber diet.                           - Continue present medications.                           - Await pathology results. Ladene Artist, MD 03/10/2016 9:59:57 AM This report has been signed electronically.

## 2016-03-11 ENCOUNTER — Telehealth: Payer: Self-pay | Admitting: *Deleted

## 2016-03-11 NOTE — Telephone Encounter (Signed)
No answer, message left for the patient. 

## 2016-03-17 ENCOUNTER — Encounter: Payer: Self-pay | Admitting: Gastroenterology

## 2016-09-07 ENCOUNTER — Other Ambulatory Visit: Payer: Self-pay | Admitting: General Surgery

## 2016-09-07 DIAGNOSIS — K4090 Unilateral inguinal hernia, without obstruction or gangrene, not specified as recurrent: Secondary | ICD-10-CM

## 2016-09-10 ENCOUNTER — Ambulatory Visit
Admission: RE | Admit: 2016-09-10 | Discharge: 2016-09-10 | Disposition: A | Discharge status: Home / Self Care | Payer: Medicare Other | Source: Ambulatory Visit | Attending: General Surgery | Admitting: General Surgery

## 2016-09-10 ENCOUNTER — Other Ambulatory Visit: Payer: Medicare Other

## 2016-09-10 DIAGNOSIS — K4090 Unilateral inguinal hernia, without obstruction or gangrene, not specified as recurrent: Secondary | ICD-10-CM

## 2019-06-13 ENCOUNTER — Encounter: Payer: Self-pay | Admitting: Cardiology

## 2019-06-13 ENCOUNTER — Other Ambulatory Visit: Payer: Self-pay | Admitting: Cardiology

## 2019-06-13 ENCOUNTER — Ambulatory Visit (INDEPENDENT_AMBULATORY_CARE_PROVIDER_SITE_OTHER): Payer: Medicare Other | Admitting: Cardiology

## 2019-06-13 ENCOUNTER — Other Ambulatory Visit: Payer: Self-pay

## 2019-06-13 VITALS — BP 118/95 | HR 55 | Temp 97.5°F | Ht 73.0 in | Wt 245.0 lb

## 2019-06-13 DIAGNOSIS — I4891 Unspecified atrial fibrillation: Secondary | ICD-10-CM | POA: Insufficient documentation

## 2019-06-13 DIAGNOSIS — R931 Abnormal findings on diagnostic imaging of heart and coronary circulation: Secondary | ICD-10-CM | POA: Diagnosis not present

## 2019-06-13 DIAGNOSIS — E782 Mixed hyperlipidemia: Secondary | ICD-10-CM | POA: Diagnosis not present

## 2019-06-13 MED ORDER — APIXABAN 5 MG PO TABS: 5.0000 mg | ORAL_TABLET | Freq: Two times a day (BID) | ORAL | 2 refills | Status: DC

## 2019-06-13 MED ORDER — ROSUVASTATIN CALCIUM 20 MG PO TABS: 20.0000 mg | ORAL_TABLET | Freq: Every day | ORAL | 11 refills | Status: DC

## 2019-06-13 MED ORDER — DILTIAZEM HCL ER COATED BEADS 120 MG PO CP24: 120.0000 mg | ORAL_CAPSULE | Freq: Every day | ORAL | 11 refills | Status: DC

## 2019-06-13 NOTE — Progress Notes (Signed)
Patient referred by Aletha Halim., PA-C for atrial fibrillation  Subjective:   Ronald Davis, male    DOB: [birth year], 68 y.o.   MRN: [id number]   Chief Complaint  Patient presents with  . Atrial Fibrillation  . New Patient (Initial Visit)    HPI  68 year old Caucasian male referred for evaluation and management of atrial fibrillation.  Patient is a retired Conservator, museum/gallery, lives with his wife.  He stays active with outdoor activities such as walking, although, he has gained 12 pounds through the pandemic due to decreased overall activity.  He denies any chest pain, shortness of breath, palpitations.  He is not aware of any irregular heartbeat.  He has noticed lightheadedness on standing up from a sitting position over the last few weeks.  Patient was being seen for annual wellness visit with his PCP today, and he was found to be in atrial fibrillation.  Patient tells me that he has never known to be in atrial fibrillation before.  He underwent calcium score test in 2015, which reportedly showed calcium score 400.  Subsequently, he underwent walking/Lexiscan nuclear stress test which was normal.  Patient drinks 3 alcoholic drinks every day.  This includes 1 mixed vodka drinks and 2 glasses of wine.  He he denies any alcohol dependence.  He has been consuming this much alcohol for several years.  Patient has had obstructive sleep apnea in the past, diagnosed in 2005.  He subsequently underwent tonsillectomy, uvulectomy, and rhinoplasty.  Following this, he had complete resolution of his snoring symptoms.  Patient has known precancerous colonic polyps, last checked in 2017.  There were 3 in number.  He has never had any hematochezia or melena.  He is going to undergo repeat colonoscopy sometime early 2021.  Past Medical History:  Diagnosis Date  . Allergy   . Sleep apnea      Past Surgical History:  Procedure Laterality Date  . COLONOSCOPY    . ears     ears pinned back   . INGUINAL HERNIA REPAIR     right  . MOLE REMOVAL    . POLYPECTOMY    . RHINOPLASTY      for sleep apnea  . tonisllectomy       Social History   Socioeconomic History  . Marital status: Married    Spouse name: Not on file  . Number of children: Not on file  . Years of education: Not on file  . Highest education level: Not on file  Occupational History  . Not on file  Social Needs  . Financial resource strain: Not on file  . Food insecurity    Worry: Not on file    Inability: Not on file  . Transportation needs    Medical: Not on file    Non-medical: Not on file  Tobacco Use  . Smoking status: Never Smoker  . Smokeless tobacco: Never Used  Substance and Sexual Activity  . Alcohol use: Yes    Alcohol/week: 21.0 standard drinks    Types: 7 Glasses of wine, 7 Standard drinks or equivalent, 7 Shots of liquor per week  . Drug use: No  . Sexual activity: Not on file  Lifestyle  . Physical activity    Days per week: Not on file    Minutes per session: Not on file  . Stress: Not on file  Relationships  . Social Herbalist on phone: Not on file    Gets together:  Not on file    Attends religious service: Not on file    Active member of club or organization: Not on file    Attends meetings of clubs or organizations: Not on file    Relationship status: Not on file  . Intimate partner violence    Fear of current or ex partner: Not on file    Emotionally abused: Not on file    Physically abused: Not on file    Forced sexual activity: Not on file  Other Topics Concern  . Not on file  Social History Narrative  . Not on file     Family History  Problem Relation Age of Onset  . Colon cancer Maternal Grandmother      Current Outpatient Medications on File Prior to Visit  Medication Sig Dispense Refill  . clonazePAM (KLONOPIN) 0.5 MG tablet Take 1 tablet by mouth as needed.    . tamsulosin (FLOMAX) 0.4 MG CAPS capsule Take 0.4 mg by mouth.    . zolpidem  (AMBIEN) 10 MG tablet Take 1 tablet by mouth as needed.     No current facility-administered medications on file prior to visit.     Cardiovascular studies:  EKG 06/13/2019: Atrial fibrillation with rapid ventricular rate 127 bpm.  2015: Normal walking/lexiscan nuclear stress test  Recent labs: 06/11/2019: Glucose 100.  BUN/creatinine 16/1.41.  EGFR 15.  Sodium 138, potassium 4.8.  Rest of the CMP normal. H/H 14.8/46.5.  MCV 73.  Platelets 231. Cholesterol 195, triglycerides 245, HDL 33, LDL 131. PSA 4.58, mildly elevated.   Review of Systems  Constitution: Negative for decreased appetite, malaise/fatigue, weight gain and weight loss.  HENT: Negative for congestion.   Eyes: Negative for visual disturbance.  Cardiovascular: Negative for chest pain, dyspnea on exertion, leg swelling, palpitations and syncope.  Respiratory: Negative for cough.   Endocrine: Negative for cold intolerance.  Hematologic/Lymphatic: Does not bruise/bleed easily.  Skin: Negative for itching and rash.  Musculoskeletal: Negative for myalgias.  Gastrointestinal: Negative for abdominal pain, nausea and vomiting.  Genitourinary: Negative for dysuria.  Neurological: Positive for light-headedness. Negative for dizziness and weakness.  Psychiatric/Behavioral: The patient is not nervous/anxious.   All other systems reviewed and are negative.        Vitals:   06/13/19 1211 06/13/19 1228  BP: (!) 146/94 (!) 118/95  Pulse: (!) 55   Temp: (!) 97.5 F (36.4 C)   SpO2: 97%      Body mass index is 32.32 kg/m. Filed Weights   06/13/19 1211  Weight: 111.1 kg     Objective:   Physical Exam  Constitutional: He is oriented to person, place, and time. He appears well-developed and well-nourished. No distress.  HENT:  Head: Normocephalic and atraumatic.  Eyes: Pupils are equal, round, and reactive to light. Conjunctivae are normal.  Neck: No JVD present.  Cardiovascular: Intact distal pulses. An  irregularly irregular rhythm present. Tachycardia present.  No murmur heard. Pulmonary/Chest: Effort normal and breath sounds normal. He has no wheezes. He has no rales.  Abdominal: Soft. Bowel sounds are normal. There is no rebound.  Musculoskeletal:        General: No edema.  Lymphadenopathy:    He has no cervical adenopathy.  Neurological: He is alert and oriented to person, place, and time. No cranial nerve deficit.  Skin: Skin is warm and dry.  Psychiatric: He has a normal mood and affect.  Nursing note and vitals reviewed.         Assessment &  Recommendations:   68 year old Caucasian male with history of elevated calcium score (400), hyperlipidemia, CKD 3, referred for evaluation and management of atrial fibrillation.  Atrial fibrillation: Likely persistent.  Currently, rate is uncontrolled.  He is mildly symptomatic with symptoms of lightheadedness, but is not aware of any irregular heartbeat or palpitations.   Recommend starting diltiazem 120 mg once daily for rate control.  Encourage liberal hydration to avoid hypotension.   CHA2DS2VAsc score 2, annual stroke risk 2.2%, given his known elevated calcium score of 400 noted in 2015.  Recommend starting Eliquis 5 mg twice daily for anticoagulation at least for now.  The goal will be to attempt cardioversion 4 weeks from now, and continue anticoagulation at least 4 to 6 weeks on either side of cardioversion.  Subsequently, will further need to assess risks and benefits of long term anticoagulation, and evaluate his tolerance to Eliquis, given that he has known precancerous colon polyps.  I recommend that he avoids NSAIDs or aspirin to reduce bleeding risk.  I will obtain echocardiogram and exercise nuclear stress test to evaluate for ischemia, after cardioversion.  Finally, I also discussed with the patient regarding alcohol consumption to be possibly a risk factor for his atrial fibrillation.  He currently consumes 3 alcoholic  drinks per day.  I recommended him to reduce it to 1-2 drinks/daily.  Hyperlipidemia: Given elevated calcium score 400, and elevated LDL, started Crestor 20 mg daily.   Thank you for referring the patient to [place]. Please feel free to contact with any questions.  Nigel Mormon, MD [facility] Pager: [phone] Office: [phone] If no answer Cell [phone]

## 2019-06-19 ENCOUNTER — Telehealth: Payer: Self-pay

## 2019-06-19 NOTE — Telephone Encounter (Signed)
No. I would like perform echo and stress test after cardioversion. This will give more accurate results.

## 2019-07-02 ENCOUNTER — Telehealth: Payer: Self-pay

## 2019-07-02 NOTE — Progress Notes (Signed)
Cardiology Office Note:   Date:  07/03/2019  NAME:  Ronald Davis    MRN: [id number] DOB:  04-19-1951   PCP:  Aletha Halim., PA-C  Cardiologist:  No primary care provider on file.  Electrophysiologist:  None   Referring MD: Aletha Halim., PA-C   Chief Complaint  Patient presents with   Atrial Fibrillation   History of Present Illness:   Ronald Davis is a 68 y.o. male with a hx of atrial fibrillation who is being seen today for the evaluation of atrial fibrillation at the request of Aletha Halim., PA-C. He was recently seen by [facility] who recommended DCCV. He presents for second opinion.  He was seen by his primary care physician for what appears to be 4 months of dizziness with standing.  He was noted to be in atrial fibrillation referred to [facility].  Regarding symptoms he appears to have had 4 months of dizziness upon standing.  He did report some exertional shortness of breath as well.  This led to the EKG diagnosis of atrial fibrillation.  Regarding symptoms he reports no chest pain or trouble breathing.  He was started on diltiazem reports that his symptoms have gotten better.  His thyroid studies were normal.  He does have a history of sleep apnea that was treated with neck surgery.  He reports that he is not had a repeat sleep study but no longer snores.  He also has gained weight and is needing to exercise more.  Despite this, he reports no symptoms with climbing up to 2 flights of stairs such as chest pain or shortness of breath.  He was started on Eliquis without any bleeding issues.  He also was started on Crestor for his coronary calcium score that was elevated in the past.  I reported to him that I agreed with the assessment made by Dr. Virgina Jock, but I would also recommend an echocardiogram and even possibly a TEE to expedite his cardioversion.  I discussed with him that medications versus ablation will likely be  needed if the cardioversion is unsuccessful or does not maintain sinus rhythm. He reportedly had a CAC score >400 in 2015. NM stress test that followed was normal without evidence of ischemia.   Past Medical History: Past Medical History:  Diagnosis Date   Allergy    Coronary artery disease    Hyperlipidemia    Low BP    Sleep apnea     Past Surgical History: Past Surgical History:  Procedure Laterality Date   COLONOSCOPY     ears     ears pinned back   INGUINAL HERNIA REPAIR     right   MOLE REMOVAL     POLYPECTOMY     RHINOPLASTY      for sleep apnea   tonisllectomy      Current Medications: Current Meds  Medication Sig   apixaban (ELIQUIS) 5 MG TABS tablet Take 1 tablet (5 mg total) by mouth 2 (two) times daily.   clonazePAM (KLONOPIN) 0.5 MG tablet Take 1 tablet by mouth as needed.   diltiazem (CARDIZEM CD) 120 MG 24 hr capsule Take 1 capsule (120 mg total) by mouth daily.   rosuvastatin (CRESTOR) 20 MG tablet Take 1 tablet (20 mg total) by mouth at bedtime.   tamsulosin (FLOMAX) 0.4 MG CAPS capsule Take 0.4 mg by mouth.   zolpidem (AMBIEN) 10 MG tablet Take 1 tablet by mouth as needed.  Allergies:    Amoxicillin and Tomato   Social History: Social History   Socioeconomic History   Marital status: Married    Spouse name: Not on file   Number of children: 0   Years of education: Not on file   Highest education level: Not on file  Occupational History   Not on file  Social Needs   Financial resource strain: Not on file   Food insecurity    Worry: Not on file    Inability: Not on file   Transportation needs    Medical: Not on file    Non-medical: Not on file  Tobacco Use   Smoking status: Never Smoker   Smokeless tobacco: Never Used  Substance and Sexual Activity   Alcohol use: Yes    Alcohol/week: 2.0 standard drinks    Types: 2 Glasses of wine per week    Comment: daily   Drug use: No   Sexual activity: Not on  file  Lifestyle   Physical activity    Days per week: Not on file    Minutes per session: Not on file   Stress: Not on file  Relationships   Social connections    Talks on phone: Not on file    Gets together: Not on file    Attends religious service: Not on file    Active member of club or organization: Not on file    Attends meetings of clubs or organizations: Not on file    Relationship status: Not on file  Other Topics Concern   Not on file  Social History Narrative   Forensic psychologist, retired      Family History: The patient's family history includes CAD in his father; Colon cancer in his maternal grandmother; Colon polyps in his brother, brother, sister, sister, sister, and sister; Prostate cancer in his father; Stroke in his father and mother.  ROS:   All other ROS reviewed and negative. Pertinent positives noted in the HPI.     EKGs/Labs/Other Studies Reviewed:   The following studies were personally reviewed by me today:  EKG:  EKG is ordered today.  The ekg ordered today demonstrates atrial fibrillation, heart rate 103, normal axis, no evidence of prior infarction, no acute ST-T changes, and was personally reviewed by me.   Recent Labs: No results found for requested labs within last 8760 hours.   Recent Lipid Panel No results found for: CHOL, TRIG, HDL, CHOLHDL, VLDL, LDLCALC, LDLDIRECT  Physical Exam:   VS:  BP 135/74    Pulse (!) 112    Temp (!) 97 F (36.1 C)    Ht 6\' 1"  (1.854 m)    Wt 249 lb 6.4 oz (113.1 kg)    SpO2 98%    BMI 32.90 kg/m    Wt Readings from Last 3 Encounters:  07/03/19 249 lb 6.4 oz (113.1 kg)  06/13/19 245 lb (111.1 kg)  03/10/16 234 lb (106.1 kg)    General: Well nourished, well developed, in no acute distress Heart: Atraumatic, normal size  Eyes: PEERLA, EOMI  Neck: Supple, no JVD Endocrine: No thryomegaly Cardiac: Normal S1, S2; RRR; no murmurs, rubs, or gallops Lungs: Clear to auscultation bilaterally, no wheezing,  rhonchi or rales  Abd: Soft, nontender, no hepatomegaly  Ext: No edema, pulses 2+ Musculoskeletal: No deformities, BUE and BLE strength normal and equal Skin: Warm and dry, no rashes   Neuro: Alert and oriented to person, place, time, and situation, CNII-XII grossly intact, no focal deficits  Psych: Normal mood and affect   ASSESSMENT:   Ronald Davis is a 68 y.o. male who presents for the following: 1. Persistent atrial fibrillation ([place])   2. Coronary artery disease involving native coronary artery of native heart without angina pectoris   3. Mixed hyperlipidemia     PLAN:   1. Persistent atrial fibrillation ([place]) -Recently diagnosed atrial fibrillation there is likely been going on for about 4 months.  Thyroid studies are normal.  No recent echocardiogram that I can tell.  I do agree that an attempt at restoration of sinus rhythm is prudent.  I discussed with him that he could pursue a TEE/cardioversion or strictly Eliquis 3 weeks before his cardioversion.  He has reported that he will continue with plan by Dr. Virgina Jock and will continue under his care.  He will continue Eliquis for 3 to 4 weeks prior to cardioversion and then the discussion regarding long-term anticoagulation will be made between he and Dr. Virgina Jock.  I think this is reasonable and he will let us know if he would like to see [place] in the future or pursue our care.  I did counsel him extensively on his diagnosis of atrial fibrillation, and likely risk factors of excess alcohol use and possibly sleep apnea that is returned.  I also recommend that he continue to exercise and lose weight as this will help keep him in sinus rhythm.  He reports he will do this moving forward.  2. Coronary artery disease involving native coronary artery of native heart without angina pectoris -Elevated coronary calcium score seen on scan in 2015.  No symptoms of angina.  I recommended an aspirin in addition to his Crestor.  I discussed him that his  goal LDL is less than 70.  3. Mixed hyperlipidemia -Goal LDL less than 70.  He will continue Crestor and follow with his primary cardiologist.   Disposition: Return if symptoms worsen or fail to improve.  Medication Adjustments/Labs and Tests Ordered: Current medicines are reviewed at length with the patient today.  Concerns regarding medicines are outlined above.  Orders Placed This Encounter  Procedures   EKG 12-Lead   No orders of the defined types were placed in this encounter.   Patient Instructions  Medication Instructions:  The current medical regimen is effective;  continue present plan and medications.  *If you need a refill on your cardiac medications before your next appointment, please call your pharmacy*  Follow-Up: At [facility], you and your health needs are our priority.  As part of our continuing mission to provide you with exceptional heart care, we have created designated Provider Care Teams.  These Care Teams include your primary Cardiologist (physician) and Advanced Practice Providers (APPs -  Physician Assistants and Nurse Practitioners) who all work together to provide you with the care you need, when you need it.  Your next appointment:   as needed  The format for your next appointment:   Either In Person or Virtual  Provider:   Dr.O'Neal       Signed, [name] T. Audie Box, [facility]  [address], [place], [place] [address] [phone]  07/03/2019 10:17 AM

## 2019-07-02 NOTE — H&P (View-Only) (Signed)
Cardiology Office Note:   Date:  07/03/2019  NAME:  Ronald Davis    MRN: [id number] DOB:  February 12, 1951   PCP:  Aletha Halim., PA-C  Cardiologist:  No primary care provider on file.  Electrophysiologist:  None   Referring MD: Aletha Halim., PA-C   Chief Complaint  Patient presents with   Atrial Fibrillation   History of Present Illness:   Ronald Davis is a 68 y.o. male with a hx of atrial fibrillation who is being seen today for the evaluation of atrial fibrillation at the request of Aletha Halim., PA-C. He was recently seen by [facility] who recommended DCCV. He presents for second opinion.  He was seen by his primary care physician for what appears to be 4 months of dizziness with standing.  He was noted to be in atrial fibrillation referred to [facility].  Regarding symptoms he appears to have had 4 months of dizziness upon standing.  He did report some exertional shortness of breath as well.  This led to the EKG diagnosis of atrial fibrillation.  Regarding symptoms he reports no chest pain or trouble breathing.  He was started on diltiazem reports that his symptoms have gotten better.  His thyroid studies were normal.  He does have a history of sleep apnea that was treated with neck surgery.  He reports that he is not had a repeat sleep study but no longer snores.  He also has gained weight and is needing to exercise more.  Despite this, he reports no symptoms with climbing up to 2 flights of stairs such as chest pain or shortness of breath.  He was started on Eliquis without any bleeding issues.  He also was started on Crestor for his coronary calcium score that was elevated in the past.  I reported to him that I agreed with the assessment made by Dr. Virgina Jock, but I would also recommend an echocardiogram and even possibly a TEE to expedite his cardioversion.  I discussed with him that medications versus ablation will likely be  needed if the cardioversion is unsuccessful or does not maintain sinus rhythm. He reportedly had a CAC score >400 in 2015. NM stress test that followed was normal without evidence of ischemia.   Past Medical History: Past Medical History:  Diagnosis Date   Allergy    Coronary artery disease    Hyperlipidemia    Low BP    Sleep apnea     Past Surgical History: Past Surgical History:  Procedure Laterality Date   COLONOSCOPY     ears     ears pinned back   INGUINAL HERNIA REPAIR     right   MOLE REMOVAL     POLYPECTOMY     RHINOPLASTY      for sleep apnea   tonisllectomy      Current Medications: Current Meds  Medication Sig   apixaban (ELIQUIS) 5 MG TABS tablet Take 1 tablet (5 mg total) by mouth 2 (two) times daily.   clonazePAM (KLONOPIN) 0.5 MG tablet Take 1 tablet by mouth as needed.   diltiazem (CARDIZEM CD) 120 MG 24 hr capsule Take 1 capsule (120 mg total) by mouth daily.   rosuvastatin (CRESTOR) 20 MG tablet Take 1 tablet (20 mg total) by mouth at bedtime.   tamsulosin (FLOMAX) 0.4 MG CAPS capsule Take 0.4 mg by mouth.   zolpidem (AMBIEN) 10 MG tablet Take 1 tablet by mouth as needed.  Allergies:    Amoxicillin and Tomato   Social History: Social History   Socioeconomic History   Marital status: Married    Spouse name: Not on file   Number of children: 0   Years of education: Not on file   Highest education level: Not on file  Occupational History   Not on file  Social Needs   Financial resource strain: Not on file   Food insecurity    Worry: Not on file    Inability: Not on file   Transportation needs    Medical: Not on file    Non-medical: Not on file  Tobacco Use   Smoking status: Never Smoker   Smokeless tobacco: Never Used  Substance and Sexual Activity   Alcohol use: Yes    Alcohol/week: 2.0 standard drinks    Types: 2 Glasses of wine per week    Comment: daily   Drug use: No   Sexual activity: Not on  file  Lifestyle   Physical activity    Days per week: Not on file    Minutes per session: Not on file   Stress: Not on file  Relationships   Social connections    Talks on phone: Not on file    Gets together: Not on file    Attends religious service: Not on file    Active member of club or organization: Not on file    Attends meetings of clubs or organizations: Not on file    Relationship status: Not on file  Other Topics Concern   Not on file  Social History Narrative   Forensic psychologist, retired      Family History: The patient's family history includes CAD in his father; Colon cancer in his maternal grandmother; Colon polyps in his brother, brother, sister, sister, sister, and sister; Prostate cancer in his father; Stroke in his father and mother.  ROS:   All other ROS reviewed and negative. Pertinent positives noted in the HPI.     EKGs/Labs/Other Studies Reviewed:   The following studies were personally reviewed by me today:  EKG:  EKG is ordered today.  The ekg ordered today demonstrates atrial fibrillation, heart rate 103, normal axis, no evidence of prior infarction, no acute ST-T changes, and was personally reviewed by me.   Recent Labs: No results found for requested labs within last 8760 hours.   Recent Lipid Panel No results found for: CHOL, TRIG, HDL, CHOLHDL, VLDL, LDLCALC, LDLDIRECT  Physical Exam:   VS:  BP 135/74    Pulse (!) 112    Temp (!) 97 F (36.1 C)    Ht 6\' 1"  (1.854 m)    Wt 249 lb 6.4 oz (113.1 kg)    SpO2 98%    BMI 32.90 kg/m    Wt Readings from Last 3 Encounters:  07/03/19 249 lb 6.4 oz (113.1 kg)  06/13/19 245 lb (111.1 kg)  03/10/16 234 lb (106.1 kg)    General: Well nourished, well developed, in no acute distress Heart: Atraumatic, normal size  Eyes: PEERLA, EOMI  Neck: Supple, no JVD Endocrine: No thryomegaly Cardiac: Normal S1, S2; RRR; no murmurs, rubs, or gallops Lungs: Clear to auscultation bilaterally, no wheezing,  rhonchi or rales  Abd: Soft, nontender, no hepatomegaly  Ext: No edema, pulses 2+ Musculoskeletal: No deformities, BUE and BLE strength normal and equal Skin: Warm and dry, no rashes   Neuro: Alert and oriented to person, place, time, and situation, CNII-XII grossly intact, no focal deficits  Psych: Normal mood and affect   ASSESSMENT:   NICOLE MCGOVERN is a 68 y.o. male who presents for the following: 1. Persistent atrial fibrillation ([place])   2. Coronary artery disease involving native coronary artery of native heart without angina pectoris   3. Mixed hyperlipidemia     PLAN:   1. Persistent atrial fibrillation ([place]) -Recently diagnosed atrial fibrillation there is likely been going on for about 4 months.  Thyroid studies are normal.  No recent echocardiogram that I can tell.  I do agree that an attempt at restoration of sinus rhythm is prudent.  I discussed with him that he could pursue a TEE/cardioversion or strictly Eliquis 3 weeks before his cardioversion.  He has reported that he will continue with plan by Dr. Virgina Jock and will continue under his care.  He will continue Eliquis for 3 to 4 weeks prior to cardioversion and then the discussion regarding long-term anticoagulation will be made between he and Dr. Virgina Jock.  I think this is reasonable and he will let us know if he would like to see [place] in the future or pursue our care.  I did counsel him extensively on his diagnosis of atrial fibrillation, and likely risk factors of excess alcohol use and possibly sleep apnea that is returned.  I also recommend that he continue to exercise and lose weight as this will help keep him in sinus rhythm.  He reports he will do this moving forward.  2. Coronary artery disease involving native coronary artery of native heart without angina pectoris -Elevated coronary calcium score seen on scan in 2015.  No symptoms of angina.  I recommended an aspirin in addition to his Crestor.  I discussed him that his  goal LDL is less than 70.  3. Mixed hyperlipidemia -Goal LDL less than 70.  He will continue Crestor and follow with his primary cardiologist.   Disposition: Return if symptoms worsen or fail to improve.  Medication Adjustments/Labs and Tests Ordered: Current medicines are reviewed at length with the patient today.  Concerns regarding medicines are outlined above.  Orders Placed This Encounter  Procedures   EKG 12-Lead   No orders of the defined types were placed in this encounter.   Patient Instructions  Medication Instructions:  The current medical regimen is effective;  continue present plan and medications.  *If you need a refill on your cardiac medications before your next appointment, please call your pharmacy*  Follow-Up: At [facility], you and your health needs are our priority.  As part of our continuing mission to provide you with exceptional heart care, we have created designated Provider Care Teams.  These Care Teams include your primary Cardiologist (physician) and Advanced Practice Providers (APPs -  Physician Assistants and Nurse Practitioners) who all work together to provide you with the care you need, when you need it.  Your next appointment:   as needed  The format for your next appointment:   Either In Person or Virtual  Provider:   Dr.O'Neal       Signed, [name] T. Audie Box, [facility]  [address], [place], [place] [address] [phone]  07/03/2019 10:17 AM

## 2019-07-02 NOTE — Telephone Encounter (Signed)
FAXED NOTES TO [facility] 

## 2019-07-03 ENCOUNTER — Ambulatory Visit (INDEPENDENT_AMBULATORY_CARE_PROVIDER_SITE_OTHER): Payer: Medicare Other | Admitting: Cardiovascular Disease

## 2019-07-03 ENCOUNTER — Encounter: Payer: Self-pay | Admitting: Cardiovascular Disease

## 2019-07-03 ENCOUNTER — Telehealth: Payer: Self-pay | Admitting: Cardiovascular Disease

## 2019-07-03 ENCOUNTER — Other Ambulatory Visit: Payer: Self-pay

## 2019-07-03 VITALS — BP 135/74 | HR 112 | Temp 97.0°F | Ht 73.0 in | Wt 249.4 lb

## 2019-07-03 DIAGNOSIS — I2510 Atherosclerotic heart disease of native coronary artery without angina pectoris: Secondary | ICD-10-CM

## 2019-07-03 DIAGNOSIS — I4819 Other persistent atrial fibrillation: Secondary | ICD-10-CM | POA: Diagnosis not present

## 2019-07-03 DIAGNOSIS — E782 Mixed hyperlipidemia: Secondary | ICD-10-CM | POA: Diagnosis not present

## 2019-07-03 NOTE — Patient Instructions (Addendum)
Medication Instructions:  The current medical regimen is effective;  continue present plan and medications.  *If you need a refill on your cardiac medications before your next appointment, please call your pharmacy*  Follow-Up: At [facility], you and your health needs are our priority.  As part of our continuing mission to provide you with exceptional heart care, we have created designated Provider Care Teams.  These Care Teams include your primary Cardiologist (physician) and Advanced Practice Providers (APPs -  Physician Assistants and Nurse Practitioners) who all work together to provide you with the care you need, when you need it.  Your next appointment:   as needed  The format for your next appointment:   Either In Person or Virtual  Provider:   Dr.O'Neal

## 2019-07-03 NOTE — Telephone Encounter (Signed)
New message:      Patient calling stating that he would like for Dr. Farris Has to do his procedure and would like it done around the sametime. Please call patient back.

## 2019-07-03 NOTE — Telephone Encounter (Signed)
Spoke with patient and he would like to stay with Dr Audie Box Will forward for review

## 2019-07-04 ENCOUNTER — Telehealth: Payer: Self-pay | Admitting: Cardiovascular Disease

## 2019-07-04 ENCOUNTER — Telehealth: Payer: Self-pay

## 2019-07-04 DIAGNOSIS — I4819 Other persistent atrial fibrillation: Secondary | ICD-10-CM

## 2019-07-04 NOTE — Telephone Encounter (Signed)
Called patient Dr.O'Neal wants to see you back in office 12/14.Appointment scheduled 07/30/19 at 2:00 pm.

## 2019-07-04 NOTE — Telephone Encounter (Signed)
Called Mr. Meersman back. Will get him to return call. Seems he wants to do his cardioversion with [place].   [name] T. Audie Box, [place]  [address], [place], [place] [address] [phone]  8:59 AM

## 2019-07-04 NOTE — Telephone Encounter (Signed)
Called Mrs. Loletha Grayer. We will go ahead with planning TEE/DCCV. Likely first week of December. Will call them back to get scheduled.   [name] T. Audie Box, [place]  [address] [place], [place] [address] [phone]  12:51 PM

## 2019-07-04 NOTE — Telephone Encounter (Signed)
Contacted pt to set up TEE cardioversion. Reviewed available procedure dates and discussed need for lab work and COVID test. Pt okay with anytime for procedure and afternoon for COVID test  Informed pt that nurse would call back after speaking with lab about available times for procedure.   Contacted pt again after setting up procedure. Spoke with pt wife and reviewed procedure instructions. She is aware that letter of instructions and lab slip to be mailed to patient. Pt wife verbalized understanding of the following:  Lab work 12/1 at [facility] covid test 12/1 at 12:00pm at [facility] Procedure 12/4. Arrive at [facility] at 10:00am  She will relay info to husband and is aware to call back with any questions or concerns. Mailed  letter of instructions and lab slip to pt on 11/18.

## 2019-07-05 ENCOUNTER — Other Ambulatory Visit: Payer: Self-pay | Admitting: Cardiovascular Disease

## 2019-07-05 DIAGNOSIS — I4819 Other persistent atrial fibrillation: Secondary | ICD-10-CM

## 2019-07-05 NOTE — Progress Notes (Signed)
TTE ordered for atrial fibrillation.  [name] T. Audie Box, [place]  [address], [place], [place] [address] [phone]  9:01 AM

## 2019-07-13 ENCOUNTER — Other Ambulatory Visit (HOSPITAL_COMMUNITY): Payer: Self-pay

## 2019-07-16 ENCOUNTER — Other Ambulatory Visit (HOSPITAL_COMMUNITY): Payer: Medicare Other

## 2019-07-17 ENCOUNTER — Ambulatory Visit (HOSPITAL_COMMUNITY): Admission: RE | Admit: 2019-07-17 | Payer: Medicare Other | Source: Home / Self Care | Admitting: Cardiology

## 2019-07-17 ENCOUNTER — Other Ambulatory Visit (HOSPITAL_COMMUNITY)
Admission: RE | Admit: 2019-07-17 | Discharge: 2019-07-17 | Disposition: A | Discharge status: Home / Self Care | Payer: Medicare Other | Source: Ambulatory Visit | Attending: Internal Medicine | Admitting: Internal Medicine

## 2019-07-17 ENCOUNTER — Encounter (HOSPITAL_COMMUNITY): Admission: RE | Payer: Self-pay | Source: Home / Self Care

## 2019-07-17 DIAGNOSIS — Z20828 Contact with and (suspected) exposure to other viral communicable diseases: Secondary | ICD-10-CM | POA: Diagnosis not present

## 2019-07-17 DIAGNOSIS — Z01812 Encounter for preprocedural laboratory examination: Secondary | ICD-10-CM | POA: Insufficient documentation

## 2019-07-17 LAB — BASIC METABOLIC PANEL
BUN/Creatinine Ratio: 13 (ref 10–24)
BUN: 18 mg/dL (ref 8–27)
CO2: 19 mmol/L — ABNORMAL LOW (ref 20–29)
Calcium: 9.2 mg/dL (ref 8.6–10.2)
Chloride: 105 mmol/L (ref 96–106)
Creatinine, Ser: 1.38 mg/dL — ABNORMAL HIGH (ref 0.76–1.27)
GFR calc Af Amer: 59 mL/min/{1.73_m2} — ABNORMAL LOW (ref >59)
GFR calc non Af Amer: 51 mL/min/{1.73_m2} — ABNORMAL LOW (ref >59)
Glucose: 107 mg/dL — ABNORMAL HIGH (ref 65–99)
Potassium: 4.5 mmol/L (ref 3.5–5.2)
Sodium: 137 mmol/L (ref 134–144)

## 2019-07-17 LAB — CBC
Hematocrit: 47.5 % (ref 37.5–51.0)
Hemoglobin: 14.6 g/dL (ref 13.0–17.7)
MCH: 22.4 pg — ABNORMAL LOW (ref 26.6–33.0)
MCHC: 30.7 g/dL — ABNORMAL LOW (ref 31.5–35.7)
MCV: 73 fL — ABNORMAL LOW (ref 79–97)
Platelets: 274 10*3/uL (ref 150–450)
RBC: 6.51 x10E6/uL — ABNORMAL HIGH (ref 4.14–5.80)
RDW: 15.1 % (ref 11.6–15.4)
WBC: 8.6 10*3/uL (ref 3.4–10.8)

## 2019-07-17 SURGERY — CARDIOVERSION
Anesthesia: General

## 2019-07-19 LAB — NOVEL CORONAVIRUS, NAA (HOSP ORDER, SEND-OUT TO REF LAB; TAT 18-24 HRS): SARS-CoV-2, NAA: NOT DETECTED

## 2019-07-20 ENCOUNTER — Encounter (HOSPITAL_COMMUNITY): Payer: Self-pay | Admitting: Emergency Medicine

## 2019-07-20 ENCOUNTER — Ambulatory Visit (HOSPITAL_COMMUNITY): Payer: Medicare Other | Admitting: Anesthesiology

## 2019-07-20 ENCOUNTER — Encounter (HOSPITAL_COMMUNITY)
Admission: RE | Disposition: A | Discharge status: Home / Self Care | Payer: Self-pay | Source: Home / Self Care | Attending: Internal Medicine

## 2019-07-20 ENCOUNTER — Other Ambulatory Visit: Payer: Self-pay

## 2019-07-20 ENCOUNTER — Ambulatory Visit (HOSPITAL_BASED_OUTPATIENT_CLINIC_OR_DEPARTMENT_OTHER)
Admission: RE | Admit: 2019-07-20 | Discharge: 2019-07-20 | Disposition: A | Discharge status: Home / Self Care | Payer: Medicare Other | Source: Ambulatory Visit | Attending: Cardiovascular Disease | Admitting: Cardiovascular Disease

## 2019-07-20 ENCOUNTER — Ambulatory Visit (HOSPITAL_COMMUNITY)
Admission: RE | Admit: 2019-07-20 | Discharge: 2019-07-20 | Disposition: A | Discharge status: Home / Self Care | Payer: Medicare Other | Attending: Internal Medicine | Admitting: Internal Medicine

## 2019-07-20 DIAGNOSIS — I420 Dilated cardiomyopathy: Secondary | ICD-10-CM | POA: Diagnosis not present

## 2019-07-20 DIAGNOSIS — G4730 Sleep apnea, unspecified: Secondary | ICD-10-CM | POA: Diagnosis not present

## 2019-07-20 DIAGNOSIS — Z8249 Family history of ischemic heart disease and other diseases of the circulatory system: Secondary | ICD-10-CM | POA: Insufficient documentation

## 2019-07-20 DIAGNOSIS — Z880 Allergy status to penicillin: Secondary | ICD-10-CM | POA: Diagnosis not present

## 2019-07-20 DIAGNOSIS — I340 Nonrheumatic mitral (valve) insufficiency: Secondary | ICD-10-CM

## 2019-07-20 DIAGNOSIS — E782 Mixed hyperlipidemia: Secondary | ICD-10-CM | POA: Insufficient documentation

## 2019-07-20 DIAGNOSIS — I4819 Other persistent atrial fibrillation: Secondary | ICD-10-CM | POA: Diagnosis present

## 2019-07-20 DIAGNOSIS — Z7901 Long term (current) use of anticoagulants: Secondary | ICD-10-CM | POA: Diagnosis not present

## 2019-07-20 DIAGNOSIS — I2510 Atherosclerotic heart disease of native coronary artery without angina pectoris: Secondary | ICD-10-CM | POA: Insufficient documentation

## 2019-07-20 DIAGNOSIS — I700 Atherosclerosis of aorta: Secondary | ICD-10-CM | POA: Diagnosis not present

## 2019-07-20 DIAGNOSIS — Z79899 Other long term (current) drug therapy: Secondary | ICD-10-CM | POA: Insufficient documentation

## 2019-07-20 HISTORY — PX: TEE WITHOUT CARDIOVERSION: SHX5443

## 2019-07-20 HISTORY — PX: CARDIOVERSION: SHX1299

## 2019-07-20 SURGERY — ECHOCARDIOGRAM, TRANSESOPHAGEAL
Anesthesia: General

## 2019-07-20 MED ORDER — SODIUM CHLORIDE 0.9 % IV SOLN: INTRAVENOUS | Status: DC | PRN

## 2019-07-20 MED ORDER — PROPOFOL 10 MG/ML IV BOLUS
INTRAVENOUS | Status: DC | PRN
  Administered 2019-07-20 (×5): 20 mg via INTRAVENOUS

## 2019-07-20 MED ORDER — LIDOCAINE VISCOUS HCL 2 % MT SOLN
OROMUCOSAL | Status: AC
  Filled 2019-07-20: qty 15

## 2019-07-20 MED ORDER — PROPOFOL 500 MG/50ML IV EMUL
INTRAVENOUS | Status: DC | PRN
  Administered 2019-07-20: 100 ug/kg/min via INTRAVENOUS

## 2019-07-20 MED ORDER — LACTATED RINGERS IV SOLN
INTRAVENOUS | Status: DC | PRN
  Administered 2019-07-20: 10:00:00 via INTRAVENOUS

## 2019-07-20 MED ORDER — LIDOCAINE VISCOUS HCL 2 % MT SOLN
OROMUCOSAL | Status: DC | PRN
  Administered 2019-07-20: 20 mL via OROMUCOSAL

## 2019-07-20 NOTE — Anesthesia Preprocedure Evaluation (Addendum)
Anesthesia Evaluation  Patient identified by MRN, date of birth, ID band Patient awake    Reviewed: Allergy & Precautions, NPO status , Patient's Chart, lab work & pertinent test results  Airway Mallampati: II  TM Distance: >3 FB Neck ROM: Full    Dental  (+) Chipped,    Pulmonary sleep apnea ,    Pulmonary exam normal        Cardiovascular + CAD  + dysrhythmias Atrial Fibrillation  Rhythm:Irregular Rate:Tachycardia  ECG: a-fib with RVR, rate 103   Neuro/Psych negative neurological ROS  negative psych ROS   GI/Hepatic negative GI ROS, Neg liver ROS,   Endo/Other  negative endocrine ROS  Renal/GU negative Renal ROS     Musculoskeletal negative musculoskeletal ROS (+)   Abdominal (+) + obese,   Peds  Hematology HLD   Anesthesia Other Findings persistent atrial fibrillation with RVR  Reproductive/Obstetrics                            Anesthesia Physical Anesthesia Plan  ASA: IV  Anesthesia Plan: General   Post-op Pain Management:    Induction: Intravenous  PONV Risk Score and Plan: 2 and Propofol infusion and Treatment may vary due to age or medical condition  Airway Management Planned: Nasal Cannula  Additional Equipment:   Intra-op Plan:   Post-operative Plan:   Informed Consent: I have reviewed the patients History and Physical, chart, labs and discussed the procedure including the risks, benefits and alternatives for the proposed anesthesia with the patient or authorized representative who has indicated his/her understanding and acceptance.     Dental advisory given  Plan Discussed with: CRNA  Anesthesia Plan Comments:        Anesthesia Quick Evaluation

## 2019-07-20 NOTE — Discharge Instructions (Signed)

## 2019-07-20 NOTE — Progress Notes (Signed)
  Echocardiogram Echocardiogram Transesophageal has been performed.  [name] [name] 07/20/2019, 10:57 AM

## 2019-07-20 NOTE — Interval H&P Note (Signed)
History and Physical Interval Note:  07/20/2019 10:12 AM  Ronald Davis  has presented today for surgery, with the diagnosis of persistent atrial fibrillation.  The various methods of treatment have been discussed with the patient and family. After consideration of risks, benefits and other options for treatment, the patient has consented to  Procedure(s): TRANSESOPHAGEAL ECHOCARDIOGRAM (TEE) (N/A) CARDIOVERSION (N/A) as a surgical intervention.  The patient's history has been reviewed, patient examined, no change in status, stable for surgery.  I have reviewed the patient's chart and labs.  Questions were answered to the patient's satisfaction.     Dorris Carnes

## 2019-07-20 NOTE — Anesthesia Postprocedure Evaluation (Signed)
Anesthesia Post Note  Patient: Ronald Davis  Procedure(s) Performed: TRANSESOPHAGEAL ECHOCARDIOGRAM (TEE) (N/A ) CARDIOVERSION (N/A )     Patient location during evaluation: Endoscopy Anesthesia Type: General Level of consciousness: awake and alert Pain management: pain level controlled Vital Signs Assessment: post-procedure vital signs reviewed and stable Respiratory status: spontaneous breathing, nonlabored ventilation, respiratory function stable and patient connected to nasal cannula oxygen Cardiovascular status: blood pressure returned to baseline and stable Postop Assessment: no apparent nausea or vomiting Anesthetic complications: no    Last Vitals:  Vitals:   07/20/19 1056 07/20/19 1106  BP: 133/73 108/74  Pulse: 70 67  Resp: 20 12  Temp: 36.4 C   SpO2: 95% 92%    Last Pain:  Vitals:   07/20/19 1106  TempSrc:   PainSc: 0-No pain                 Ryan P Ellender

## 2019-07-20 NOTE — CV Procedure (Signed)
TEE  Patient sedated by anesthesia with Propofol intravenously Bite guard placed  TEE probe advanced to mid esophagus without difficulty   LA, LAA without masses  LA is enlarged No PFO by color doppler   There is very mild prolapse of the posterior mitral leaflet.   MR is moderate to severe  Both centrally and anterior directed  TV is normal   Trivial TR AV is grossly normal   No AI PV is normal  Trivial PI  LVEF and RVEF are normal  Fixed plaquing in aortic arch and carotid artery   Full report to follow  Dorris Carnes MD

## 2019-07-20 NOTE — CV Procedure (Signed)
CARDIOVERSION   Pt sedated with Propofol gtt  With pads in AP position, patient cardioverted to SR with 200 J synchronized biphasic energy     Procedures (TEE/DCCV ) were without complication  12 lead EKG pending   Dorris Carnes MD

## 2019-07-20 NOTE — Transfer of Care (Signed)
Immediate Anesthesia Transfer of Care Note  Patient: Ronald Davis  Procedure(s) Performed: TRANSESOPHAGEAL ECHOCARDIOGRAM (TEE) (N/A ) CARDIOVERSION (N/A )  Patient Location: Endoscopy Unit  Anesthesia Type:General  Level of Consciousness: drowsy and patient cooperative  Airway & Oxygen Therapy: Patient Spontanous Breathing and Patient connected to nasal cannula oxygen  Post-op Assessment: Report given to RN and Post -op Vital signs reviewed and stable  Post vital signs: Reviewed and stable  Last Vitals:  Vitals Value Taken Time  BP    Temp    Pulse 70 07/20/19 1056  Resp 20 07/20/19 1056  SpO2 95 % 07/20/19 1056  Vitals shown include unvalidated device data.  Last Pain:  Vitals:   07/20/19 0955  TempSrc: Temporal  PainSc: 0-No pain         Complications: No apparent anesthesia complications

## 2019-07-23 ENCOUNTER — Ambulatory Visit: Payer: Medicare Other | Admitting: Gastroenterology

## 2019-07-23 ENCOUNTER — Encounter (HOSPITAL_COMMUNITY): Payer: Self-pay | Admitting: Internal Medicine

## 2019-07-24 ENCOUNTER — Other Ambulatory Visit: Payer: Self-pay

## 2019-07-24 ENCOUNTER — Telehealth: Payer: Self-pay | Admitting: Cardiovascular Disease

## 2019-07-24 ENCOUNTER — Ambulatory Visit (HOSPITAL_COMMUNITY): Payer: Medicare Other | Attending: Cardiology

## 2019-07-24 DIAGNOSIS — I4819 Other persistent atrial fibrillation: Secondary | ICD-10-CM | POA: Diagnosis not present

## 2019-07-24 NOTE — Telephone Encounter (Signed)
Pt called complaining of dizziness about 20 min ago and pt states dizziness is his symptom when goes into afib no other complaints Pt was just here today for echo and felt fine Pt is going to call PCP office and see if can get EKG done and maybe meds adjusted In the meantime appt made for tomorrow with Almyra Deforest PA for 11:00 am Will forward to Dr Audie Box for review and recommendations./cy

## 2019-07-24 NOTE — Telephone Encounter (Signed)
I agree with an appointment tomorrow. He will need an EKG.   Ronald Davis - I will be reading echoes tomorrow at [facility] office. I will be available if needed.   -W

## 2019-07-24 NOTE — Telephone Encounter (Signed)
STAT if patient feels like he/she is going to faint   1) Are you dizzy now? Yes  2) Do you feel faint or have you passed out? Yes, feels faint  3) Do you have any other symptoms? Head feels heavy   4) Have you checked your HR and BP (record if available)? No  Patient has been feeling this way for the last 15 minutes

## 2019-07-25 ENCOUNTER — Encounter: Payer: Self-pay | Admitting: Physician Assistant

## 2019-07-25 ENCOUNTER — Ambulatory Visit (INDEPENDENT_AMBULATORY_CARE_PROVIDER_SITE_OTHER): Payer: Medicare Other | Admitting: Physician Assistant

## 2019-07-25 ENCOUNTER — Telehealth: Payer: Self-pay | Admitting: Cardiovascular Disease

## 2019-07-25 VITALS — BP 118/80 | HR 84 | Temp 96.6°F | Ht 73.0 in | Wt 238.6 lb

## 2019-07-25 DIAGNOSIS — E785 Hyperlipidemia, unspecified: Secondary | ICD-10-CM | POA: Diagnosis not present

## 2019-07-25 DIAGNOSIS — I2584 Coronary atherosclerosis due to calcified coronary lesion: Secondary | ICD-10-CM

## 2019-07-25 DIAGNOSIS — I2510 Atherosclerotic heart disease of native coronary artery without angina pectoris: Secondary | ICD-10-CM | POA: Diagnosis not present

## 2019-07-25 DIAGNOSIS — I4819 Other persistent atrial fibrillation: Secondary | ICD-10-CM

## 2019-07-25 DIAGNOSIS — I951 Orthostatic hypotension: Secondary | ICD-10-CM

## 2019-07-25 DIAGNOSIS — I340 Nonrheumatic mitral (valve) insufficiency: Secondary | ICD-10-CM

## 2019-07-25 NOTE — Telephone Encounter (Signed)
Patient was here today to see Almyra Deforest, PA.  He states it was mentioned abut his having a cardioversion on 08/02/19.  He also has an appointment in the A Fib clinic on 08/01/19.

## 2019-07-25 NOTE — Telephone Encounter (Signed)
Spoke with Almyra Deforest, PA-C and he will call the patient to go over the change of plan that was originally discussed and answer any and all questions the patient may have.

## 2019-07-25 NOTE — Progress Notes (Signed)
Cardiology Office Note:    Date:  07/27/2019   ID:  Ronald Davis, DOB 1950/11/12, MRN [id number]  PCP:  Aletha Halim., PA-C  Cardiologist:  Evalina Field, MD  Electrophysiologist:  None   Referring MD: Aletha Halim., PA-C   Chief Complaint  Patient presents with  . Follow-up    seen for Dr. Audie Box    History of Present Illness:    Ronald Davis is a 68 y.o. male with a hx of coronary calcification, obstructive sleep apnea s/p tonsillectomy/uvulectomy and rhinoplasty, hyperlipidemia and atrial fibrillation.  He has underwent calcium scoring in 2015 which showed a calcium score 400.  Subsequent stress test was normal.  Patient was diagnosed with atrial fibrillation in October 2020 by his primary care provider who later referred him to [facility] cardiovascular.  Dr. Virgina Jock started him on Eliquis and Cardizem.  It was recommended for him to proceed with cardioversion, he later saw Dr. Audie Box on 07/03/2019 for second opinion.  His symptom has improved after starting on the diltiazem.  Initially Dr. Audie Box recommended him to proceed with cardioversion recommended by Dr. Virgina Jock, however later patient establish wished to stay with Dr. Audie Box.  We arranged for him to proceed with TEE DCCV on 07/20/2019 and he subsequently underwent successful cardioversion.  TEE at the time demonstrated EF 60 to 65%, moderate LAE, moderate to severe MR with central and anteriorly directed jets.  He also underwent a 2D echo on 07/24/2019 which showed EF 60 to 65%, normal LAE, mild MR mild to moderate aortic calcification without evidence of aortic stenosis, normal PA peak pressure.    Patient called the cardiology service yesterday afternoon complaining of recurrent dizziness which was the symptom when he went into atrial fibrillation.  EKG obtained at PCPs office showed recurrent atrial fibrillation.  Patient was added on to today's visit for evaluation of atrial fibrillation.  He is having  significant dizzy spell with changing the body position.  Orthostatic vital sign was positive for orthostatic hypotension.  He says he is drinking plenty of fluid.  However his weight is 11 pounds less.  I recommend obtaining some lab work to make sure he is not being dehydrated.  We reviewed the nature of his atrial fibrillation.  Given significant orthostatic hypotension, I do not recommend increasing the current diltiazem dose.  I would recommend rhythm control.  He will be a candidate for both amiodarone or Tikosyn.  I would avoid class Ic antiarrhythmic drug given his prior history of coronary calcification.  After discussing with Dr. Audie Box, he recommended referred to atrial fibrillation clinic to consider Tikosyn loading and atrial fibrillation ablation thereafter.  He is concerned about side effects and toxicity from amiodarone.  Orthostatic vital signs Lying 116/75 heart rate 85 Sitting 109/77 heart rate is 67 Standing 93/59 heart rate 48 (manual auscultation suggest heart rate is not bradycardic, mildly tachycardic, this recorded heart rate is incorrect) 2-minute standing 124/67, heart rate 64.   Past Medical History:  Diagnosis Date  . Allergy   . Coronary artery disease   . Hyperlipidemia   . Low BP   . Sleep apnea     Past Surgical History:  Procedure Laterality Date  . CARDIOVERSION N/A 07/20/2019   Procedure: CARDIOVERSION;  Surgeon: Fay Records, MD;  Location: [facility] ENDOSCOPY;  Service: Cardiovascular;  Laterality: N/A;  . COLONOSCOPY    . ears     ears pinned back  . INGUINAL HERNIA REPAIR     right  .  MOLE REMOVAL    . POLYPECTOMY    . RHINOPLASTY      for sleep apnea  . TEE WITHOUT CARDIOVERSION N/A 07/20/2019   Procedure: TRANSESOPHAGEAL ECHOCARDIOGRAM (TEE);  Surgeon: Fay Records, MD;  Location: [facility] ENDOSCOPY;  Service: Cardiovascular;  Laterality: N/A;  . tonisllectomy      Current Medications: Current Meds  Medication Sig  . apixaban (ELIQUIS) 5 MG TABS  tablet Take 1 tablet (5 mg total) by mouth 2 (two) times daily.  . clobetasol cream (TEMOVATE) [id number] % Apply 1 application topically at bedtime as needed (rash).  . clonazePAM (KLONOPIN) 0.5 MG tablet Take 0.5 mg by mouth daily as needed for anxiety.   Marland Kitchen diltiazem (CARDIZEM CD) 120 MG 24 hr capsule Take 1 capsule (120 mg total) by mouth daily.  . rosuvastatin (CRESTOR) 20 MG tablet Take 1 tablet (20 mg total) by mouth at bedtime.  . tamsulosin (FLOMAX) 0.4 MG CAPS capsule Take 0.4 mg by mouth daily.   Marland Kitchen zolpidem (AMBIEN) 10 MG tablet Take 1 tablet by mouth daily as needed for sleep.      Allergies:   Amoxicillin, Naproxen, and Tomato   Social History   Socioeconomic History  . Marital status: Married    Spouse name: Not on file  . Number of children: 0  . Years of education: Not on file  . Highest education level: Not on file  Occupational History  . Not on file  Tobacco Use  . Smoking status: Never Smoker  . Smokeless tobacco: Never Used  Substance and Sexual Activity  . Alcohol use: Yes    Alcohol/week: 2.0 standard drinks    Types: 2 Glasses of wine per week    Comment: daily  . Drug use: No  . Sexual activity: Not on file  Other Topics Concern  . Not on file  Social History Narrative   Forensic psychologist, retired    Investment banker, operational of Radio broadcast assistant Strain:   . Difficulty of Paying Living Expenses: Not on file  Food Insecurity:   . Worried About Charity fundraiser in the Last Year: Not on file  . Ran Out of Food in the Last Year: Not on file  Transportation Needs:   . Lack of Transportation (Medical): Not on file  . Lack of Transportation (Non-Medical): Not on file  Physical Activity:   . Days of Exercise per Week: Not on file  . Minutes of Exercise per Session: Not on file  Stress:   . Feeling of Stress : Not on file  Social Connections:   . Frequency of Communication with Friends and Family: Not on file  . Frequency of Social Gatherings with  Friends and Family: Not on file  . Attends Religious Services: Not on file  . Active Member of Clubs or Organizations: Not on file  . Attends Archivist Meetings: Not on file  . Marital Status: Not on file     Family History: The patient's family history includes CAD in his father; Colon cancer in his maternal grandmother; Colon polyps in his brother, brother, sister, sister, sister, and sister; Prostate cancer in his father; Stroke in his father and mother.  ROS:   Please see the history of present illness.     All other systems reviewed and are negative.  EKGs/Labs/Other Studies Reviewed:    The following studies were reviewed today:  Echo 07/24/2019 IMPRESSIONS    1. Left ventricular ejection fraction, by visual estimation, is  60 to 65%. The left ventricle has normal function. There is no left ventricular hypertrophy.  2. Left ventricular diastolic function could not be evaluated.  3. Global right ventricle has normal systolic function.The right ventricular size is normal. No increase in right ventricular wall thickness.  4. Left atrial size was normal.  5. Right atrial size was normal.  6. The mitral valve is normal in structure. Mild mitral valve regurgitation. No evidence of mitral stenosis.  7. The tricuspid valve is normal in structure. Tricuspid valve regurgitation is mild.  8. The aortic valve is normal in structure. Aortic valve regurgitation is not visualized. Mild to moderate aortic valve sclerosis/calcification without any evidence of aortic stenosis.  9. The pulmonic valve was normal in structure. Pulmonic valve regurgitation is not visualized. 10. Normal pulmonary artery systolic pressure. 11. The inferior vena cava is normal in size with greater than 50% respiratory variability, suggesting right atrial pressure of 3 mmHg.  EKG:  EKG is not ordered today.  The ekg obtained yesterday by PCP was reviewed in the office which demonstrates atrial fibrillation,  heart rate 103.   Recent Labs: 07/17/2019: BUN 18; Creatinine, Ser 1.38; Hemoglobin 14.6; Platelets 274; Potassium 4.5; Sodium 137  Recent Lipid Panel No results found for: CHOL, TRIG, HDL, CHOLHDL, VLDL, LDLCALC, LDLDIRECT  Physical Exam:    VS:  BP 118/80 (BP Location: Right Arm, Patient Position: Supine, Cuff Size: Normal)   Pulse 84   Temp (!) 96.6 F (35.9 C)   Ht 6\' 1"  (1.854 m)   Wt 238 lb 9.6 oz (108.2 kg)   SpO2 97%   BMI 31.48 kg/m     Wt Readings from Last 3 Encounters:  07/25/19 238 lb 9.6 oz (108.2 kg)  07/20/19 249 lb 6.4 oz (113.1 kg)  07/03/19 249 lb 6.4 oz (113.1 kg)     GEN:  Well nourished, well developed in no acute distress HEENT: Normal NECK: No JVD; No carotid bruits LYMPHATICS: No lymphadenopathy CARDIAC: irregularly irregular, no murmurs, rubs, gallops RESPIRATORY:  Clear to auscultation without rales, wheezing or rhonchi  ABDOMEN: Soft, non-tender, non-distended MUSCULOSKELETAL:  No edema; No deformity  SKIN: Warm and dry NEUROLOGIC:  Alert and oriented x 3 PSYCHIATRIC:  Normal affect   ASSESSMENT:    1. Persistent atrial fibrillation ([place])   2. Hyperlipidemia LDL goal <100   3. Coronary artery calcification   4. Mild mitral regurgitation   5. Orthostatic hypotension    PLAN:    In order of problems listed above:  1. Atrial fibrillation: Patient had recurrence of atrial fibrillation.  Unable to uptitrate rate control agent due to significant orthostatic hypotension.  We discussed the various options, at this time I suggest antiarrhythmic therapy.  Patient was seen by Dr. Audie Box during today's visit as well.  We discussed possibility between amiodarone versus Tikosyn, we do not think he would be a candidate for class Ic antiarrhythmic therapy given history of coronary artery calcification.  Dr. Audie Box would prefer to consider Tikosyn loading.  He recommended referred the patient to atrial fibrillation clinic to discuss benefit and risk of  Tikosyn loading.  2. Hyperlipidemia: Continue Crestor  3. Coronary artery calcification: Patient has known history of coronary artery calcification however never had a cardiac catheterization  4. Mild mitral regurgitation: Although previous echocardiogram suggestive of moderate to severe MR, however recent 2D echo showed this is actually mild.  This is confirmed by Dr. Audie Box who has personally reviewed recent echocardiogram.  5. Orthostatic hypotension: Unable to  further uptitrate rate control agent.   Medication Adjustments/Labs and Tests Ordered: Current medicines are reviewed at length with the patient today.  Concerns regarding medicines are outlined above.  Orders Placed This Encounter  Procedures  . CBC  . Basic metabolic panel  . Amb Referral to AFIB Clinic   No orders of the defined types were placed in this encounter.   Patient Instructions  Medication Instructions:   Your physician recommends that you continue on your current medications as directed. Please refer to the Current Medication list given to you today.  *If you need a refill on your cardiac medications before your next appointment, please call your pharmacy*  Lab Work: You will need to have labs (blood work) drawn in 1 week:  BMET  CBC  If you have labs (blood work) drawn today and your tests are completely normal, you will receive your results only by: Marland Kitchen MyChart Message (if you have MyChart) OR . A paper copy in the mail If you have any lab test that is abnormal or we need to change your treatment, we will call you to review the results.  Testing/Procedures:  NONE ordered at this time of appointment   Follow-Up:  At [facility], you and your health needs are our priority.  As part of our continuing mission to provide you with exceptional heart care, we have created designated Provider Care Teams.  These Care Teams include your primary Cardiologist (physician) and Advanced Practice Providers  (APPs -  Physician Assistants and Nurse Practitioners) who all work together to provide you with the care you need, when you need it.   You have been referred to A-fib clinic  Your next appointment:   3 week(s)  The format for your next appointment:   In Person  Provider:   Eleonore Chiquito, MD  Other Instructions      Signed, Almyra Deforest, [place]  07/27/2019 11:05 PM    [place]

## 2019-07-25 NOTE — Patient Instructions (Signed)
Medication Instructions:   Your physician recommends that you continue on your current medications as directed. Please refer to the Current Medication list given to you today.  *If you need a refill on your cardiac medications before your next appointment, please call your pharmacy*  Lab Work: You will need to have labs (blood work) drawn in 1 week:  BMET  CBC  If you have labs (blood work) drawn today and your tests are completely normal, you will receive your results only by: Marland Kitchen MyChart Message (if you have MyChart) OR . A paper copy in the mail If you have any lab test that is abnormal or we need to change your treatment, we will call you to review the results.  Testing/Procedures:  NONE ordered at this time of appointment   Follow-Up:  At [facility], you and your health needs are our priority.  As part of our continuing mission to provide you with exceptional heart care, we have created designated Provider Care Teams.  These Care Teams include your primary Cardiologist (physician) and Advanced Practice Providers (APPs -  Physician Assistants and Nurse Practitioners) who all work together to provide you with the care you need, when you need it.   You have been referred to A-fib clinic  Your next appointment:   3 week(s)  The format for your next appointment:   In Person  Provider:   Eleonore Chiquito, MD  Other Instructions

## 2019-07-25 NOTE — Telephone Encounter (Signed)
Patient called requesting his wife come to his appointment 07/25/19 at 35. He states she is his primary care giver.

## 2019-07-25 NOTE — Telephone Encounter (Signed)
I cannot see any reason in chart patient would need his wife to attend appointment. Will route to PA & CMA to advise

## 2019-07-26 ENCOUNTER — Ambulatory Visit: Payer: Medicare Other | Admitting: Cardiology

## 2019-07-27 ENCOUNTER — Encounter: Payer: Self-pay | Admitting: Physician Assistant

## 2019-07-30 ENCOUNTER — Ambulatory Visit: Payer: Medicare Other | Admitting: Cardiovascular Disease

## 2019-08-01 ENCOUNTER — Other Ambulatory Visit: Payer: Self-pay

## 2019-08-01 ENCOUNTER — Encounter (HOSPITAL_COMMUNITY): Payer: Self-pay | Admitting: Physician Assistant

## 2019-08-01 ENCOUNTER — Ambulatory Visit (HOSPITAL_COMMUNITY)
Admission: RE | Admit: 2019-08-01 | Discharge: 2019-08-01 | Disposition: A | Discharge status: Home / Self Care | Payer: Medicare Other | Source: Ambulatory Visit | Attending: Physician Assistant | Admitting: Physician Assistant

## 2019-08-01 VITALS — BP 124/66 | HR 62 | Ht 73.0 in | Wt 238.8 lb

## 2019-08-01 DIAGNOSIS — Z823 Family history of stroke: Secondary | ICD-10-CM | POA: Diagnosis not present

## 2019-08-01 DIAGNOSIS — Z91018 Allergy to other foods: Secondary | ICD-10-CM | POA: Insufficient documentation

## 2019-08-01 DIAGNOSIS — I480 Paroxysmal atrial fibrillation: Secondary | ICD-10-CM

## 2019-08-01 DIAGNOSIS — Z7901 Long term (current) use of anticoagulants: Secondary | ICD-10-CM | POA: Insufficient documentation

## 2019-08-01 DIAGNOSIS — E785 Hyperlipidemia, unspecified: Secondary | ICD-10-CM | POA: Diagnosis not present

## 2019-08-01 DIAGNOSIS — R001 Bradycardia, unspecified: Secondary | ICD-10-CM | POA: Insufficient documentation

## 2019-08-01 DIAGNOSIS — E669 Obesity, unspecified: Secondary | ICD-10-CM | POA: Diagnosis not present

## 2019-08-01 DIAGNOSIS — Z880 Allergy status to penicillin: Secondary | ICD-10-CM | POA: Insufficient documentation

## 2019-08-01 DIAGNOSIS — G4733 Obstructive sleep apnea (adult) (pediatric): Secondary | ICD-10-CM | POA: Diagnosis not present

## 2019-08-01 DIAGNOSIS — Z8249 Family history of ischemic heart disease and other diseases of the circulatory system: Secondary | ICD-10-CM | POA: Insufficient documentation

## 2019-08-01 DIAGNOSIS — Z886 Allergy status to analgesic agent status: Secondary | ICD-10-CM | POA: Diagnosis not present

## 2019-08-01 DIAGNOSIS — I2510 Atherosclerotic heart disease of native coronary artery without angina pectoris: Secondary | ICD-10-CM | POA: Diagnosis not present

## 2019-08-01 DIAGNOSIS — Z79899 Other long term (current) drug therapy: Secondary | ICD-10-CM | POA: Diagnosis not present

## 2019-08-01 DIAGNOSIS — Z6831 Body mass index (BMI) 31.0-31.9, adult: Secondary | ICD-10-CM | POA: Diagnosis not present

## 2019-08-01 DIAGNOSIS — Z7982 Long term (current) use of aspirin: Secondary | ICD-10-CM | POA: Insufficient documentation

## 2019-08-01 DIAGNOSIS — Z800 Family history of malignant neoplasm of digestive organs: Secondary | ICD-10-CM | POA: Diagnosis not present

## 2019-08-01 DIAGNOSIS — Z8042 Family history of malignant neoplasm of prostate: Secondary | ICD-10-CM | POA: Insufficient documentation

## 2019-08-01 DIAGNOSIS — D6869 Other thrombophilia: Secondary | ICD-10-CM | POA: Diagnosis not present

## 2019-08-01 MED ORDER — AMIODARONE HCL 200 MG PO TABS: ORAL_TABLET | ORAL | 0 refills | Status: DC

## 2019-08-01 NOTE — Progress Notes (Signed)
Primary Care Physician: Aletha Halim., PA-C Primary Cardiologist: Dr Audie Box Primary Electrophysiologist: none Referring Physician: Almyra Deforest PA   Ronald Davis is a 68 y.o. male with a history of coronary calcification, obstructive sleep apnea s/p tonsillectomy/uvulectomy and rhinoplasty, hyperlipidemia and persistent atrial fibrillation who presents for consultation in the [facility].  The patient was initially diagnosed with atrial fibrillation in October 2020 by his primary care provider who later referred him to [facility] cardiovascular.  Dr. Virgina Jock started him on Eliquis for a CHADS2VASC score of 2 and Cardizem.  It was recommended for him to proceed with cardioversion, he later saw Dr. Audie Box on 07/03/2019 for second opinion.  His symptom has improved after starting on the diltiazem.  Initially Dr. Audie Box recommended him to proceed with cardioversion recommended by Dr. Virgina Jock, however later patient establish wished to stay with Dr. Audie Box. He underwent TEE/DCCV on 07/20/2019. Unfortunately, patient was back in afib with symptoms of dizziness, exercise intolerance, and generalized weakness. Today, patient is back in SR with resolution of his symptoms. He is unsure when he converted. He does have a history of alcohol use but has not had any drinks recently.   Today, he denies symptoms of palpitations, chest pain, shortness of breath, orthopnea, PND, lower extremity edema, presyncope, syncope, snoring, daytime somnolence, bleeding, or neurologic sequela. The patient is tolerating medications without difficulties and is otherwise without complaint today.   Atrial Fibrillation Risk Factors:  he does have symptoms or diagnosis of sleep apnea. he is not on CPAP therapy. S/p surgery.  he does not have a history of rheumatic fever. he does have a history of alcohol use. The patient does have a history of early familial atrial fibrillation or other  arrhythmias. Father and brother have afib.  he has a BMI of Body mass index is 31.51 kg/m.Marland Kitchen Filed Weights   08/01/19 1339  Weight: 108.3 kg    Family History  Problem Relation Age of Onset  . Colon cancer Maternal Grandmother   . Stroke Mother   . Prostate cancer Father   . CAD Father        CABG  . Stroke Father   . Colon polyps Sister   . Colon polyps Brother   . Colon polyps Sister   . Colon polyps Sister   . Colon polyps Sister   . Colon polyps Brother      Atrial Fibrillation Management history:  Previous antiarrhythmic drugs: none Previous cardioversions: 07/20/19 Previous ablations: none CHADS2VASC score: 2 Anticoagulation history: Eliquis   Past Medical History:  Diagnosis Date  . Allergy   . Coronary artery disease   . Hyperlipidemia   . Low BP   . Sleep apnea    Past Surgical History:  Procedure Laterality Date  . CARDIOVERSION N/A 07/20/2019   Procedure: CARDIOVERSION;  Surgeon: Fay Records, MD;  [facility]: [facility] ENDOSCOPY;  Service: Cardiovascular;  Laterality: N/A;  . COLONOSCOPY    . ears     ears pinned back  . INGUINAL HERNIA REPAIR     right  . MOLE REMOVAL    . POLYPECTOMY    . RHINOPLASTY      for sleep apnea  . TEE WITHOUT CARDIOVERSION N/A 07/20/2019   Procedure: TRANSESOPHAGEAL ECHOCARDIOGRAM (TEE);  Surgeon: Fay Records, MD;  Location[facility] ENDOSCOPY;  Service: Cardiovascular;  Laterality: N/A;  . tonisllectomy      Current Outpatient Medications  Medication Sig Dispense Refill  . apixaban (ELIQUIS) 5 MG  TABS tablet Take 1 tablet (5 mg total) by mouth 2 (two) times daily. 60 tablet 2  . aspirin 81 MG EC tablet Take 81 mg by mouth daily. Swallow whole.    . clobetasol cream (TEMOVATE) [id number] % Apply 1 application topically at bedtime as needed (rash).    . clonazePAM (KLONOPIN) 0.5 MG tablet Take 0.5 mg by mouth as needed for anxiety. Taking 1/2 tablet as needed    . diltiazem (CARDIZEM CD) 120 MG 24 hr capsule Take 1 capsule (120 mg  total) by mouth daily. 30 capsule 11  . rosuvastatin (CRESTOR) 20 MG tablet Take 1 tablet (20 mg total) by mouth at bedtime. 30 tablet 11  . tamsulosin (FLOMAX) 0.4 MG CAPS capsule Take 0.4 mg by mouth daily.     Marland Kitchen zolpidem (AMBIEN) 10 MG tablet Take 1 tablet by mouth as needed for sleep.     Marland Kitchen amiodarone (PACERONE) 200 MG tablet Take one tablet twice daily x 1 month and decrease to once daily 60 tablet 0   No current facility-administered medications for this encounter.    Allergies  Allergen Reactions  . Amoxicillin Diarrhea  . Naproxen Other (See Comments)    GI bleed  . Tomato Other (See Comments)    Eczema, with red tomatoes only     Social History   Socioeconomic History  . Marital status: Married    Spouse name: Not on file  . Number of children: 0  . Years of education: Not on file  . Highest education level: Not on file  Occupational History  . Not on file  Tobacco Use  . Smoking status: Never Smoker  . Smokeless tobacco: Never Used  Substance and Sexual Activity  . Alcohol use: Yes    Comment: Rarely-   . Drug use: No  . Sexual activity: Not on file  Other Topics Concern  . Not on file  Social History Narrative   Forensic psychologist, retired    Investment banker, operational of Radio broadcast assistant Strain:   . Difficulty of Paying Living Expenses: Not on file  Food Insecurity:   . Worried About Charity fundraiser in the Last Year: Not on file  . Ran Out of Food in the Last Year: Not on file  Transportation Needs:   . Lack of Transportation (Medical): Not on file  . Lack of Transportation (Non-Medical): Not on file  Physical Activity:   . Days of Exercise per Week: Not on file  . Minutes of Exercise per Session: Not on file  Stress:   . Feeling of Stress : Not on file  Social Connections:   . Frequency of Communication with Friends and Family: Not on file  . Frequency of Social Gatherings with Friends and Family: Not on file  . Attends Religious  Services: Not on file  . Active Member of Clubs or Organizations: Not on file  . Attends Archivist Meetings: Not on file  . Marital Status: Not on file  Intimate Partner Violence:   . Fear of Current or Ex-Partner: Not on file  . Emotionally Abused: Not on file  . Physically Abused: Not on file  . Sexually Abused: Not on file     ROS- All systems are reviewed and negative except as per the HPI above.  Physical Exam: Vitals:   08/01/19 1339  BP: 124/66  Pulse: 62  Weight: 108.3 kg  Height: 6\' 1"  (1.854 m)    GEN- The patient is well  appearing obese male, alert and oriented x 3 today.   Head- normocephalic, atraumatic Eyes-  Sclera clear, conjunctiva pink Ears- hearing intact Oropharynx- clear Neck- supple  Lungs- Clear to ausculation bilaterally, normal work of breathing Heart- Regular rate and rhythm, no murmurs, rubs or gallops  GI- soft, NT, ND, + BS Extremities- no clubbing, cyanosis, or edema MS- no significant deformity or atrophy Skin- no rash or lesion Psych- euthymic mood, full affect Neuro- strength and sensation are intact  Wt Readings from Last 3 Encounters:  08/01/19 108.3 kg  07/25/19 108.2 kg  07/20/19 113.1 kg    EKG today demonstrates SR HR 62, PR 182, QRS 90, QTc 418  Echo 07/24/19 demonstrated  1. Left ventricular ejection fraction, by visual estimation, is 60 to 65%. The left ventricle has normal function. There is no left ventricular hypertrophy.  2. Left ventricular diastolic function could not be evaluated.  3. Global right ventricle has normal systolic function.The right ventricular size is normal. No increase in right ventricular wall thickness.  4. Left atrial size was normal.  5. Right atrial size was normal.  6. The mitral valve is normal in structure. Mild mitral valve regurgitation. No evidence of mitral stenosis.  7. The tricuspid valve is normal in structure. Tricuspid valve regurgitation is mild.  8. The aortic valve is  normal in structure. Aortic valve regurgitation is not visualized. Mild to moderate aortic valve sclerosis/calcification without any evidence of aortic stenosis.  9. The pulmonic valve was normal in structure. Pulmonic valve regurgitation is not visualized. 10. Normal pulmonary artery systolic pressure. 11. The inferior vena cava is normal in size with greater than 50% respiratory variability, suggesting right atrial pressure of 3 mmHg.  LA 3.3 cm  Epic records are reviewed at length today  Assessment and Plan:  1. Paroxysmal atrial fibrillation S/p TEE/DCCV on 07/20/19 with ERAF. Patient spontaneously converted back to SR. We discussed therapeutic options today including dofetilide, amiodarone, and ablation. Patient would like to avoid dofetilide admission, especially during [place] pandemic. After discussing the risks and benefits, will plan to start amiodarone 200 mg BID for 4 weeks and then 200 mg daily thereafter as a bridge to ablation. Anticipate this medicine will be short term. I did discuss that elective ablations may be postponed 2/2 the pandemic. Patient voices understanding and is agreeable to the plan. Cmet/TSH in care everywhere reviewed. Continue Eliquis 5 mg BID Continue diltiazem 120 mg daily  This patients CHA2DS2-VASc Score and unadjusted Ischemic Stroke Rate (% per year) is equal to 2.2 % stroke rate/year from a score of 2  Above score calculated as 1 point each if present [CHF, HTN, DM, Vascular=MI/PAD/Aortic Plaque, Age if 65-74, or Male] Above score calculated as 2 points each if present [Age > 75, or Stroke/TIA/TE]   2. Obesity Body mass index is 31.51 kg/m. Lifestyle modification was discussed at length including regular exercise and weight reduction. Patient remains active walking dogs daily.  3. Obstructive sleep apnea The importance of adequate treatment of sleep apnea was discussed today in order to improve our ability to maintain sinus rhythm long  term. S/p  tonsillectomy/uvulectomy and rhinoplasty. No symptoms of snoring, witnessed apnea, or daytime somnolence.    Follow up with Dr Rayann Heman to discuss ablation candidacy.    [facility] [address] [place], [place] [address] [phone] 08/01/2019 2:31 PM

## 2019-08-01 NOTE — Patient Instructions (Signed)
Start Amiodarone 200mg  twice daily for one month and decrease to once daily.

## 2019-08-06 ENCOUNTER — Telehealth: Payer: Self-pay | Admitting: Cardiovascular Disease

## 2019-08-06 NOTE — Telephone Encounter (Signed)
Discussed with Dr. Loletha Grayer about recent appointment. Was back in NSR at Afib clinic appointment. Appears to be having paroxysmal Afib that is symptomatic. Opted for short term amiodarone and then evaluation for possible ablation vs rhythm control. Amiodarone is short-term.   I will reach out to EP to get a virtual appointment to discuss ablation options.   [name] T. Audie Box, [place]  [address] [place], [place] [address] [phone]  10:21 AM

## 2019-08-17 NOTE — Progress Notes (Addendum)
Cardiology Office Note:   Date:  08/20/2019  NAME:  Ronald Davis    MRN: [id number] DOB:  [birth year]   PCP:  Aletha Halim., PA-C  Cardiologist:  Evalina Field, MD   Referring MD: Aletha Halim., PA-C   Chief Complaint  Patient presents with  . Atrial Fibrillation   History of Present Illness:   Ronald Davis is a 69 y.o. male with a hx of persistent Afib, CAD (high CAC score, normal stress), HLD who presents for follow-up of atrial fibrillation. Had TEE/DCCV with return of afib shortly after. Appears to be more paroxysmal now. Deferred on tikosyn due to pandemic. Placed on short course of amiodarone.   He reports he has developed some fatigue and cough over the past few days.  Coronavirus test is negative.  He reports he is just mainly fatigue.  Symptoms appear to be improving.  He denies any chest pain, shortness of breath or palpitations.  EKG today demonstrates normal sinus rhythm.  He believes he had no further recurrence of his atrial fibrillation since being on amiodarone.  He has plans to discuss ablation versus continued medical therapy with electrophysiology on Wednesday.  He does need to have a lipid profile performed.  He reports his primary care doctor checked it a few weeks ago and he will send this to me via [place].  He also reports issues with insomnia.  This is started recently.  He reports an inability to fall asleep and stay asleep at night.  It appears to be more of a maintenance issue for him.  Initiation of sleep is okay.  He reports no depression or other symptoms.  He is continue to work on his diet.  Regarding his amiodarone therapy, he is currently on 200 mg twice daily but will soon start 200 mg daily for 1 month.  This is a short-term option for him.  Problem List 1. Persistent Atrial Fibrillation  -TEE/DCCV 07/20/2019 -recurrence Afib 12/9 -NSR 12/6 - plans for amiodarone bridge to ablation? -on eliquis  2. CAD  -CAC >400 2015 -negative NM  MPI  Past Medical History: Past Medical History:  Diagnosis Date  . Allergy   . Coronary artery disease   . Hyperlipidemia   . Low BP   . Sleep apnea     Past Surgical History: Past Surgical History:  Procedure Laterality Date  . CARDIOVERSION N/A 07/20/2019   Procedure: CARDIOVERSION;  Surgeon: Fay Records, MD;  Location: [facility] ENDOSCOPY;  Service: Cardiovascular;  Laterality: N/A;  . COLONOSCOPY    . ears     ears pinned back  . INGUINAL HERNIA REPAIR     right  . MOLE REMOVAL    . POLYPECTOMY    . RHINOPLASTY      for sleep apnea  . TEE WITHOUT CARDIOVERSION N/A 07/20/2019   Procedure: TRANSESOPHAGEAL ECHOCARDIOGRAM (TEE);  Surgeon: Fay Records, MD;  Location: [facility] ENDOSCOPY;  Service: Cardiovascular;  Laterality: N/A;  . tonisllectomy      Current Medications: No outpatient medications have been marked as taking for the 08/20/19 encounter (Office Visit) with Geralynn Rile, MD.     Allergies:    Amoxicillin, Naproxen, and Tomato   Social History: Social History   Socioeconomic History  . Marital status: Married    Spouse name: Not on file  . Number of children: 0  . Years of education: Not on file  . Highest education level: Not on file  Occupational History  .  Not on file  Tobacco Use  . Smoking status: Never Smoker  . Smokeless tobacco: Never Used  Substance and Sexual Activity  . Alcohol use: Yes    Comment: Rarely-   . Drug use: No  . Sexual activity: Not on file  Other Topics Concern  . Not on file  Social History Narrative   Forensic psychologist, retired    Investment banker, operational of Radio broadcast assistant Strain:   . Difficulty of Paying Living Expenses: Not on file  Food Insecurity:   . Worried About Charity fundraiser in the Last Year: Not on file  . Ran Out of Food in the Last Year: Not on file  Transportation Needs:   . Lack of Transportation (Medical): Not on file  . Lack of Transportation (Non-Medical): Not on file  Physical  Activity:   . Days of Exercise per Week: Not on file  . Minutes of Exercise per Session: Not on file  Stress:   . Feeling of Stress : Not on file  Social Connections:   . Frequency of Communication with Friends and Family: Not on file  . Frequency of Social Gatherings with Friends and Family: Not on file  . Attends Religious Services: Not on file  . Active Member of Clubs or Organizations: Not on file  . Attends Archivist Meetings: Not on file  . Marital Status: Not on file     Family History: The patient's family history includes CAD in his father; Colon cancer in his maternal grandmother; Colon polyps in his brother, brother, sister, sister, sister, and sister; Prostate cancer in his father; Stroke in his father and mother.  ROS:   All other ROS reviewed and negative. Pertinent positives noted in the HPI.     EKGs/Labs/Other Studies Reviewed:   The following studies were personally reviewed by me today:  An EKG was ordered today, and demonstrates sinus rhythm, heart rate 53, normal intervals, no evidence of prior infarction, and was personally reviewed by me.  TTE 07/24/2019  1. Left ventricular ejection fraction, by visual estimation, is 60 to 65%. The left ventricle has normal function. There is no left ventricular hypertrophy.  2. Left ventricular diastolic function could not be evaluated.  3. Global right ventricle has normal systolic function.The right ventricular size is normal. No increase in right ventricular wall thickness.  4. Left atrial size was normal.  5. Right atrial size was normal.  6. The mitral valve is normal in structure. Mild mitral valve regurgitation. No evidence of mitral stenosis.  7. The tricuspid valve is normal in structure. Tricuspid valve regurgitation is mild.  8. The aortic valve is normal in structure. Aortic valve regurgitation is not visualized. Mild to moderate aortic valve sclerosis/calcification without any evidence of aortic  stenosis.  9. The pulmonic valve was normal in structure. Pulmonic valve regurgitation is not visualized. 10. Normal pulmonary artery systolic pressure. 11. The inferior vena cava is normal in size with greater than 50% respiratory variability, suggesting right atrial pressure of 3 mmHg.  Recent Labs: 07/17/2019: BUN 18; Creatinine, Ser 1.38; Hemoglobin 14.6; Platelets 274; Potassium 4.5; Sodium 137   Recent Lipid Panel No results found for: CHOL, TRIG, HDL, CHOLHDL, VLDL, LDLCALC, LDLDIRECT  Physical Exam:   VS:  BP 123/63   Pulse 97   Ht 6\' 1"  (1.854 m)   Wt 240 lb 6.4 oz (109 kg)   SpO2 97%   BMI 31.72 kg/m    Wt Readings from Last  3 Encounters:  08/20/19 240 lb 6.4 oz (109 kg)  08/01/19 238 lb 12.8 oz (108.3 kg)  07/25/19 238 lb 9.6 oz (108.2 kg)    General: Well nourished, well developed, in no acute distress Heart: Atraumatic, normal size  Eyes: PEERLA, EOMI  Neck: Supple, no JVD Endocrine: No thryomegaly Cardiac: Normal S1, S2; RRR; no murmurs, rubs, or gallops Lungs: Clear to auscultation bilaterally, no wheezing, rhonchi or rales  Abd: Soft, nontender, no hepatomegaly  Ext: No edema, pulses 2+ Musculoskeletal: No deformities, BUE and BLE strength normal and equal Skin: Warm and dry, no rashes   Neuro: Alert and oriented to person, place, time, and situation, CNII-XII grossly intact, no focal deficits  Psych: Normal mood and affect   ASSESSMENT:   Ronald Davis is a 69 y.o. male who presents for the following: 1. Persistent atrial fibrillation ([place])   2. Mixed hyperlipidemia   3. Coronary artery disease involving native coronary artery of native heart without angina pectoris   4. Primary insomnia     PLAN:   1. Persistent atrial fibrillation (HCC) -Status post TEE/cardioversion but has had return of A. fib.  Was placed on amiodarone briefly as a bridge to possible ablation.  He will follow-up with EP this week. -They did discuss Tikosyn loading with him but due  to hospitalization issues this would not be obtainable. -Possibly they will be able to initiate sotalol in the outpatient setting.  He has had a negative stress test in 2015, but due to high coronary calcium score I am reluctant to use a 1C agent.  We will allow EP to help guide [place] with antiarrhythmic therapy.  He may ultimately be a good candidate for an ablation if he wishes to avoid antiarrhythmic therapy.  2. Mixed hyperlipidemia -He will my chart message me his LDL cholesterol -We will continue him on Crestor -Goal is less than 70  3. Coronary artery disease involving native coronary artery of native heart without angina pectoris -Coronary calcium score greater than 400 on scan in 2015.  Negative MPI nuclear medicine stress test. -No symptoms of angina.  4. Primary insomnia -We will try him on trazodone 50 mg nightly  Disposition: Return in about 2 months (around 10/18/2019).  Medication Adjustments/Labs and Tests Ordered: Current medicines are reviewed at length with the patient today.  Concerns regarding medicines are outlined above.  Orders Placed This Encounter  Procedures  . EKG 12-Lead   Meds ordered this encounter  Medications  . traZODone (DESYREL) 50 MG tablet    Sig: Take 1 tablet (50 mg total) by mouth at bedtime.    Dispense:  90 tablet    Refill:  3    Patient Instructions  Medication Instructions:  Start taking 50mg  Trazodone Daily  If you need a refill on your cardiac medications before your next appointment, please call your pharmacy.   Lab work: NONE  Testing/Procedures: NONE  Follow-Up: At [facility], you and your health needs are our priority.  As part of our continuing mission to provide you with exceptional heart care, we have created designated Provider Care Teams.  These Care Teams include your primary Cardiologist (physician) and Advanced Practice Providers (APPs -  Physician Assistants and Nurse Practitioners) who all work together to  provide you with the care you need, when you need it. You may see Evalina Field, MD or one of the following Advanced Practice Providers on your designated Care Team:    Almyra Deforest, [place]  Levada Dy  [facility], PA-C or   Roby Lofts, PA-C  Your physician wants you to follow-up in: 2 months        Signed, [name] T. Audie Box, [place]  [address] [place], [place] [address] [phone]  08/20/2019 11:55 AM     Laboratory data from PCP 05/2019 LDL Direct                                           131 mg/dL                 < 130 mg/dL Total Cholesterol                           195 MG/DL                       25 - 199 MG/DL Triglycerides                                          245 MG/DL                         10 - 150 MG/DL HDL Cholesterol                            33 MG/DL                         35 - 135 MG/DL Total Chol / HDL Cholesterol     5.9                                    < 4.5 Non-HDL Cholesterol                     162 MG/DL                       MG/DL

## 2019-08-20 ENCOUNTER — Ambulatory Visit (INDEPENDENT_AMBULATORY_CARE_PROVIDER_SITE_OTHER): Payer: Medicare Other | Admitting: Cardiovascular Disease

## 2019-08-20 ENCOUNTER — Telehealth: Payer: Self-pay

## 2019-08-20 ENCOUNTER — Other Ambulatory Visit: Payer: Self-pay

## 2019-08-20 ENCOUNTER — Encounter: Payer: Self-pay | Admitting: Cardiovascular Disease

## 2019-08-20 VITALS — BP 123/63 | HR 97 | Ht 73.0 in | Wt 240.4 lb

## 2019-08-20 DIAGNOSIS — E782 Mixed hyperlipidemia: Secondary | ICD-10-CM | POA: Diagnosis not present

## 2019-08-20 DIAGNOSIS — I4819 Other persistent atrial fibrillation: Secondary | ICD-10-CM

## 2019-08-20 DIAGNOSIS — F5101 Primary insomnia: Secondary | ICD-10-CM

## 2019-08-20 DIAGNOSIS — I2510 Atherosclerotic heart disease of native coronary artery without angina pectoris: Secondary | ICD-10-CM

## 2019-08-20 MED ORDER — TRAZODONE HCL 50 MG PO TABS: 50.0000 mg | ORAL_TABLET | Freq: Every day | ORAL | 3 refills | Status: DC

## 2019-08-20 NOTE — Patient Instructions (Addendum)
Medication Instructions:  Start taking 50mg  Trazodone Daily  If you need a refill on your cardiac medications before your next appointment, please call your pharmacy.   Lab work: NONE  Testing/Procedures: NONE  Follow-Up: At [facility], you and your health needs are our priority.  As part of our continuing mission to provide you with exceptional heart care, we have created designated Provider Care Teams.  These Care Teams include your primary Cardiologist (physician) and Advanced Practice Providers (APPs -  Physician Assistants and Nurse Practitioners) who all work together to provide you with the care you need, when you need it. You may see Evalina Field, MD or one of the following Advanced Practice Providers on your designated Care Team:    Almyra Deforest, PA-C  Fabian Sharp, [place] or   Roby Lofts, [place]  Your physician wants you to follow-up in: 2 months

## 2019-08-20 NOTE — Telephone Encounter (Signed)
Called pt and left message to let pt know that as long as his covid test was negative and he could pass the COVID screenings at the front then he was fine to come back

## 2019-08-22 ENCOUNTER — Telehealth (INDEPENDENT_AMBULATORY_CARE_PROVIDER_SITE_OTHER): Payer: Medicare Other | Admitting: Internal Medicine

## 2019-08-22 ENCOUNTER — Telehealth: Payer: Self-pay

## 2019-08-22 ENCOUNTER — Encounter: Payer: Self-pay | Admitting: Internal Medicine

## 2019-08-22 VITALS — Ht 73.0 in | Wt 235.0 lb

## 2019-08-22 DIAGNOSIS — I4819 Other persistent atrial fibrillation: Secondary | ICD-10-CM

## 2019-08-22 DIAGNOSIS — I4891 Unspecified atrial fibrillation: Secondary | ICD-10-CM

## 2019-08-22 MED ORDER — METOPROLOL TARTRATE 100 MG PO TABS: ORAL_TABLET | ORAL | 0 refills | Status: DC

## 2019-08-22 NOTE — Telephone Encounter (Signed)
-----   Message from Thompson Grayer, MD sent at 08/22/2019 10:55 AM EST ----- Afib abltion [place]/ ICE/ anesthesia  Cardiac CT  Stop ASA

## 2019-08-22 NOTE — H&P (View-Only) (Signed)
Electrophysiology TeleHealth Note   Due to national recommendations of social distancing due to [place] 19, Audio/video telehealth visit is felt to be most appropriate for this patient at this time.  See MyChart message from today for patient consent regarding telehealth for Ronald Davis.   Date:  08/22/2019   ID:  Ronald Davis, DOB [birth year], MRN [id number]  Location: home  Provider location: [place] [place] Evaluation Performed: New patient consult  PCP:  Aletha Halim., PA-C  Cardiologist:  Evalina Field, MD  Electrophysiologist:  None   Chief Complaint:  afib  History of Present Illness:    Ronald Davis is a 69 y.o. male who presents via audio/video conferencing for a telehealth visit today.   The patient is referred for new consultation regarding afib by Dr Marisue Ivan and the AF clinic. The patient has symptomatic afib with symptoms of decreased exercise tolerance and fatigue.  He was evaluated and started on amiodarone.  He feels much better with sinus.  He has insomnia which he attributes to amiodarone.  He also worries about long term risks of this medicine.  Today, he denies symptoms of palpitations, chest pain, shortness of breath, orthopnea, PND, lower extremity edema, claudication, dizziness, presyncope, syncope, bleeding, or neurologic sequela. The patient is tolerating medications without difficulties and is otherwise without complaint today.   he denies symptoms of cough, fevers, chills, or new SOB worrisome for COVID 19.   Past Medical History:  Diagnosis Date  . Allergy   . Coronary artery disease   . Hyperlipidemia   . Low BP   . Sleep apnea     Past Surgical History:  Procedure Laterality Date  . CARDIOVERSION N/A 07/20/2019   Procedure: CARDIOVERSION;  Surgeon: Fay Records, MD;  Location: [facility] ENDOSCOPY;  Service: Cardiovascular;  Laterality: N/A;  . COLONOSCOPY    . ears     ears pinned back  . INGUINAL HERNIA REPAIR     right  . MOLE REMOVAL     . POLYPECTOMY    . RHINOPLASTY      for sleep apnea  . TEE WITHOUT CARDIOVERSION N/A 07/20/2019   Procedure: TRANSESOPHAGEAL ECHOCARDIOGRAM (TEE);  Surgeon: Fay Records, MD;  Location: [facility] ENDOSCOPY;  Service: Cardiovascular;  Laterality: N/A;  . tonisllectomy      Current Outpatient Medications  Medication Sig Dispense Refill  . amiodarone (PACERONE) 200 MG tablet Take one tablet twice daily x 1 month and decrease to once daily 60 tablet 0  . apixaban (ELIQUIS) 5 MG TABS tablet Take 1 tablet (5 mg total) by mouth 2 (two) times daily. 60 tablet 2  . aspirin 81 MG EC tablet Take 81 mg by mouth daily. Swallow whole.    . clobetasol cream (TEMOVATE) AB-123456789 % Apply 1 application topically at bedtime as needed (rash).    . clonazePAM (KLONOPIN) 0.5 MG tablet Take 0.5 mg by mouth as needed for anxiety. Taking 1/2 tablet as needed    . diltiazem (CARDIZEM CD) 120 MG 24 hr capsule Take 1 capsule (120 mg total) by mouth daily. 30 capsule 11  . rosuvastatin (CRESTOR) 20 MG tablet Take 1 tablet (20 mg total) by mouth at bedtime. 30 tablet 11  . tamsulosin (FLOMAX) 0.4 MG CAPS capsule Take 0.4 mg by mouth daily.     . traZODone (DESYREL) 50 MG tablet Take 1 tablet (50 mg total) by mouth at bedtime. 90 tablet 3  . zolpidem (AMBIEN) 10 MG tablet Take 1 tablet  by mouth as needed for sleep.      No current facility-administered medications for this visit.    Allergies:   Amoxicillin, Naproxen, and Tomato   Social History:  The patient  reports that he has never smoked. He has never used smokeless tobacco. He reports current alcohol use. He reports that he does not use drugs.   Family History:  The patient's family history includes CAD in his father; Colon cancer in his maternal grandmother; Colon polyps in his brother, brother, sister, sister, sister, and sister; Prostate cancer in his father; Stroke in his father and mother.    ROS:  Please see the history of present illness.   All other systems are  personally reviewed and negative.    Exam:    Vital Signs:  Ht 6\' 1"  (1.854 m)   Wt 235 lb (106.6 kg)   BMI 31.00 kg/m   Well appearing, alert and conversant, regular work of breathing,  good skin color Eyes- anicteric, neuro- grossly intact, skin- no apparent rash or lesions or cyanosis, mouth- oral mucosa is pink   Labs/Other Tests and Data Reviewed:    Recent Labs: 07/17/2019: BUN 18; Creatinine, Ser 1.38; Hemoglobin 14.6; Platelets 274; Potassium 4.5; Sodium 137   Wt Readings from Last 3 Encounters:  08/22/19 235 lb (106.6 kg)  08/20/19 240 lb 6.4 oz (109 kg)  08/01/19 238 lb 12.8 oz (108.3 kg)     Other studies personally reviewed: Additional studies/ records that were reviewed today include: afib clinic notes, Dr Racheal Patches notes, echo  Review of the above records today demonstrates: as above  ASSESSMENT & PLAN:    1.  atrial fibrillation The patient has symptomatic, recurrent paroxysmal atrial fibrillation. he has failed medical therapy with amiodarone. Chads2vasc score is 2.  he is anticoagulated with eliquis .  Stop ASA at this time. Therapeutic strategies for afib including medicine and ablation were discussed in detail with the patient today. Risk, benefits, and alternatives to EP study and radiofrequency ablation for afib were also discussed in detail today. These risks include but are not limited to stroke, bleeding, vascular damage, tamponade, perforation, damage to the esophagus, lungs, and other structures, pulmonary vein stenosis, worsening renal function, and death. The patient understands these risk and wishes to proceed.  We will therefore proceed with catheter ablation at the next available time.  Carto, ICE, anesthesia are requested for the procedure.  Will also obtain cardiac CT prior to the procedure to exclude LAA thrombus and further evaluate atrial anatomy.  2. OSA Compliance with therapy is advised  3. Obesity Lifestyle modification is advised    COVID screen The patient does not have any symptoms that suggest any further testing/ screening at this time.  Social distancing reinforced today.  Current medicines are reviewed at length with the patient today.   The patient does not have concerns regarding his medicines.  The following changes were made today:  none  Labs/ tests ordered today include:  No orders of the defined types were placed in this encounter.   Patient Risk:  after full review of this patients clinical status, I feel that they are at moderate risk at this time.   Today, I have spent 20 minutes with the patient with telehealth technology discussing afib .    Signed, Thompson Grayer MD, [place] 08/22/2019 10:56 AM   [facility] [address] [place] [address] [phone] (office) [phone] (fax)

## 2019-08-22 NOTE — Progress Notes (Signed)
Electrophysiology TeleHealth Note   Due to national recommendations of social distancing due to [place] 19, Audio/video telehealth visit is felt to be most appropriate for this patient at this time.  See MyChart message from today for patient consent regarding telehealth for [facility].   Date:  08/22/2019   ID:  [name], DOB [birth year], MRN [id number]  Location: home  Provider location: [facility] [place] Evaluation Performed: New patient consult  PCP:  Aletha Halim., PA-C  Cardiologist:  Evalina Field, MD  Electrophysiologist:  None   Chief Complaint:  afib  History of Present Illness:    Ronald Davis is a 69 y.o. male who presents via audio/video conferencing for a telehealth visit today.   The patient is referred for new consultation regarding afib by Dr Marisue Ivan and the AF clinic. The patient has symptomatic afib with symptoms of decreased exercise tolerance and fatigue.  He was evaluated and started on amiodarone.  He feels much better with sinus.  He has insomnia which he attributes to amiodarone.  He also worries about long term risks of this medicine.  Today, he denies symptoms of palpitations, chest pain, shortness of breath, orthopnea, PND, lower extremity edema, claudication, dizziness, presyncope, syncope, bleeding, or neurologic sequela. The patient is tolerating medications without difficulties and is otherwise without complaint today.   he denies symptoms of cough, fevers, chills, or new SOB worrisome for COVID 19.   Past Medical History:  Diagnosis Date  . Allergy   . Coronary artery disease   . Hyperlipidemia   . Low BP   . Sleep apnea     Past Surgical History:  Procedure Laterality Date  . CARDIOVERSION N/A 07/20/2019   Procedure: CARDIOVERSION;  Surgeon: Fay Records, MD;  Location: [facility] ENDOSCOPY;  Service: Cardiovascular;  Laterality: N/A;  . COLONOSCOPY    . ears     ears pinned back  . INGUINAL HERNIA REPAIR     right  . MOLE REMOVAL     . POLYPECTOMY    . RHINOPLASTY      for sleep apnea  . TEE WITHOUT CARDIOVERSION N/A 07/20/2019   Procedure: TRANSESOPHAGEAL ECHOCARDIOGRAM (TEE);  Surgeon: Fay Records, MD;  Location: [facility] ENDOSCOPY;  Service: Cardiovascular;  Laterality: N/A;  . tonisllectomy      Current Outpatient Medications  Medication Sig Dispense Refill  . amiodarone (PACERONE) 200 MG tablet Take one tablet twice daily x 1 month and decrease to once daily 60 tablet 0  . apixaban (ELIQUIS) 5 MG TABS tablet Take 1 tablet (5 mg total) by mouth 2 (two) times daily. 60 tablet 2  . aspirin 81 MG EC tablet Take 81 mg by mouth daily. Swallow whole.    . clobetasol cream (TEMOVATE) [id number] % Apply 1 application topically at bedtime as needed (rash).    . clonazePAM (KLONOPIN) 0.5 MG tablet Take 0.5 mg by mouth as needed for anxiety. Taking 1/2 tablet as needed    . diltiazem (CARDIZEM CD) 120 MG 24 hr capsule Take 1 capsule (120 mg total) by mouth daily. 30 capsule 11  . rosuvastatin (CRESTOR) 20 MG tablet Take 1 tablet (20 mg total) by mouth at bedtime. 30 tablet 11  . tamsulosin (FLOMAX) 0.4 MG CAPS capsule Take 0.4 mg by mouth daily.     . traZODone (DESYREL) 50 MG tablet Take 1 tablet (50 mg total) by mouth at bedtime. 90 tablet 3  . zolpidem (AMBIEN) 10 MG tablet Take 1 tablet  by mouth as needed for sleep.      No current facility-administered medications for this visit.    Allergies:   Amoxicillin, Naproxen, and Tomato   Social History:  The patient  reports that he has never smoked. He has never used smokeless tobacco. He reports current alcohol use. He reports that he does not use drugs.   Family History:  The patient's family history includes CAD in his father; Colon cancer in his maternal grandmother; Colon polyps in his brother, brother, sister, sister, sister, and sister; Prostate cancer in his father; Stroke in his father and mother.    ROS:  Please see the history of present illness.   All other systems are  personally reviewed and negative.    Exam:    Vital Signs:  Ht 6\' 1"  (1.854 m)   Wt 235 lb (106.6 kg)   BMI 31.00 kg/m   Well appearing, alert and conversant, regular work of breathing,  good skin color Eyes- anicteric, neuro- grossly intact, skin- no apparent rash or lesions or cyanosis, mouth- oral mucosa is pink   Labs/Other Tests and Data Reviewed:    Recent Labs: 07/17/2019: BUN 18; Creatinine, Ser 1.38; Hemoglobin 14.6; Platelets 274; Potassium 4.5; Sodium 137   Wt Readings from Last 3 Encounters:  08/22/19 235 lb (106.6 kg)  08/20/19 240 lb 6.4 oz (109 kg)  08/01/19 238 lb 12.8 oz (108.3 kg)     Other studies personally reviewed: Additional studies/ records that were reviewed today include: afib clinic notes, Dr Racheal Patches notes, echo  Review of the above records today demonstrates: as above  ASSESSMENT & PLAN:    1.  atrial fibrillation The patient has symptomatic, recurrent paroxysmal atrial fibrillation. he has failed medical therapy with amiodarone. Chads2vasc score is 2.  he is anticoagulated with eliquis .  Stop ASA at this time. Therapeutic strategies for afib including medicine and ablation were discussed in detail with the patient today. Risk, benefits, and alternatives to EP study and radiofrequency ablation for afib were also discussed in detail today. These risks include but are not limited to stroke, bleeding, vascular damage, tamponade, perforation, damage to the esophagus, lungs, and other structures, pulmonary vein stenosis, worsening renal function, and death. The patient understands these risk and wishes to proceed.  We will therefore proceed with catheter ablation at the next available time.  Carto, ICE, anesthesia are requested for the procedure.  Will also obtain cardiac CT prior to the procedure to exclude LAA thrombus and further evaluate atrial anatomy.  2. OSA Compliance with therapy is advised  3. Obesity Lifestyle modification is advised    COVID screen The patient does not have any symptoms that suggest any further testing/ screening at this time.  Social distancing reinforced today.  Current medicines are reviewed at length with the patient today.   The patient does not have concerns regarding his medicines.  The following changes were made today:  none  Labs/ tests ordered today include:  No orders of the defined types were placed in this encounter.   Patient Risk:  after full review of this patients clinical status, I feel that they are at moderate risk at this time.   Today, I have spent 20 minutes with the patient with telehealth technology discussing afib .    Signed, Thompson Grayer MD, [place] 08/22/2019 10:56 AM   [facility] [address] [place] [address] [phone] (office) [phone] (fax)

## 2019-08-24 ENCOUNTER — Other Ambulatory Visit (HOSPITAL_COMMUNITY): Payer: Self-pay | Admitting: Physician Assistant

## 2019-08-24 NOTE — Telephone Encounter (Signed)
Left detailed message advising Pt needs to come in for lab work on 1/11.  Also advised Pt will need someone to drive him home and stay the night with him after his procedure.  Asked Pt to respond via mychart that patient understands labs on Monday and need for Pt to have someone with him night of procedure.

## 2019-08-27 ENCOUNTER — Other Ambulatory Visit: Payer: Self-pay

## 2019-08-27 ENCOUNTER — Telehealth: Payer: Medicare Other | Admitting: Internal Medicine

## 2019-08-27 ENCOUNTER — Other Ambulatory Visit: Payer: Medicare Other | Admitting: *Deleted

## 2019-08-27 DIAGNOSIS — I4819 Other persistent atrial fibrillation: Secondary | ICD-10-CM

## 2019-08-28 ENCOUNTER — Other Ambulatory Visit: Payer: Self-pay

## 2019-08-28 DIAGNOSIS — I4891 Unspecified atrial fibrillation: Secondary | ICD-10-CM

## 2019-08-28 LAB — BASIC METABOLIC PANEL
BUN/Creatinine Ratio: 11 (ref 10–24)
BUN: 18 mg/dL (ref 8–27)
CO2: 24 mmol/L (ref 20–29)
Calcium: 8.8 mg/dL (ref 8.6–10.2)
Chloride: 105 mmol/L (ref 96–106)
Creatinine, Ser: 1.67 mg/dL — ABNORMAL HIGH (ref 0.76–1.27)
GFR calc Af Amer: 47 mL/min/{1.73_m2} — ABNORMAL LOW (ref >59)
GFR calc non Af Amer: 41 mL/min/{1.73_m2} — ABNORMAL LOW (ref >59)
Glucose: 96 mg/dL (ref 65–99)
Potassium: 4.5 mmol/L (ref 3.5–5.2)
Sodium: 140 mmol/L (ref 134–144)

## 2019-08-28 LAB — CBC WITH DIFFERENTIAL/PLATELET
Basophils Absolute: 0.1 10*3/uL (ref 0.0–0.2)
Basos: 1 %
EOS (ABSOLUTE): 0.1 10*3/uL (ref 0.0–0.4)
Eos: 1 %
Hematocrit: 44.2 % (ref 37.5–51.0)
Hemoglobin: 13.4 g/dL (ref 13.0–17.7)
Immature Grans (Abs): 0 10*3/uL (ref 0.0–0.1)
Immature Granulocytes: 0 %
Lymphocytes Absolute: 1.1 10*3/uL (ref 0.7–3.1)
Lymphs: 14 %
MCH: 22.6 pg — ABNORMAL LOW (ref 26.6–33.0)
MCHC: 30.3 g/dL — ABNORMAL LOW (ref 31.5–35.7)
MCV: 75 fL — ABNORMAL LOW (ref 79–97)
Monocytes Absolute: 0.6 10*3/uL (ref 0.1–0.9)
Monocytes: 8 %
Neutrophils Absolute: 5.7 10*3/uL (ref 1.4–7.0)
Neutrophils: 76 %
Platelets: 229 10*3/uL (ref 150–450)
RBC: 5.92 x10E6/uL — ABNORMAL HIGH (ref 4.14–5.80)
RDW: 16.9 % — ABNORMAL HIGH (ref 11.6–15.4)
WBC: 7.7 10*3/uL (ref 3.4–10.8)

## 2019-08-28 MED ORDER — APIXABAN 5 MG PO TABS: 5.0000 mg | ORAL_TABLET | Freq: Two times a day (BID) | ORAL | 3 refills | Status: DC

## 2019-08-30 ENCOUNTER — Encounter (HOSPITAL_COMMUNITY): Payer: Self-pay

## 2019-08-30 ENCOUNTER — Telehealth (HOSPITAL_COMMUNITY): Payer: Self-pay | Admitting: Emergency Medicine

## 2019-08-30 ENCOUNTER — Telehealth: Payer: Self-pay | Admitting: Cardiovascular Disease

## 2019-08-30 NOTE — Telephone Encounter (Signed)
Ronald Davis from [facility] calling stating they need orders for the IV fluids that the patient needs done tomorrow morning 1/15.

## 2019-08-30 NOTE — Telephone Encounter (Signed)
Reaching out to patient to offer assistance regarding upcoming cardiac imaging study; pt verbalizes understanding of appt date/time, parking situation and where to check in, pre-test NPO status and medications ordered, and verified current allergies; name and call back number provided for further questions should they arise Marchia Bond RN Navigator Cardiac [facility] [place] [facility] [phone] office [phone] cell  Informed of medical day appt made at [facility] for IV hydration prior to contrast admin per radiology protocol

## 2019-08-30 NOTE — Telephone Encounter (Signed)
IV hydration order set ordered.

## 2019-08-31 ENCOUNTER — Ambulatory Visit (HOSPITAL_COMMUNITY)
Admission: RE | Admit: 2019-08-31 | Discharge: 2019-08-31 | Disposition: A | Discharge status: Home / Self Care | Payer: Medicare Other | Source: Ambulatory Visit | Attending: Cardiovascular Disease | Admitting: Cardiovascular Disease

## 2019-08-31 ENCOUNTER — Other Ambulatory Visit: Payer: Self-pay

## 2019-08-31 ENCOUNTER — Ambulatory Visit (HOSPITAL_COMMUNITY)
Admission: RE | Admit: 2019-08-31 | Discharge: 2019-08-31 | Disposition: A | Discharge status: Home / Self Care | Payer: Medicare Other | Source: Ambulatory Visit | Attending: Internal Medicine | Admitting: Internal Medicine

## 2019-08-31 ENCOUNTER — Other Ambulatory Visit (HOSPITAL_COMMUNITY)
Admission: RE | Admit: 2019-08-31 | Discharge: 2019-08-31 | Disposition: A | Discharge status: Home / Self Care | Payer: Medicare Other | Source: Ambulatory Visit | Attending: Internal Medicine | Admitting: Internal Medicine

## 2019-08-31 DIAGNOSIS — I4891 Unspecified atrial fibrillation: Secondary | ICD-10-CM | POA: Insufficient documentation

## 2019-08-31 DIAGNOSIS — Z01818 Encounter for other preprocedural examination: Secondary | ICD-10-CM | POA: Diagnosis not present

## 2019-08-31 DIAGNOSIS — Z20822 Contact with and (suspected) exposure to covid-19: Secondary | ICD-10-CM | POA: Insufficient documentation

## 2019-08-31 DIAGNOSIS — I2510 Atherosclerotic heart disease of native coronary artery without angina pectoris: Secondary | ICD-10-CM | POA: Diagnosis not present

## 2019-08-31 DIAGNOSIS — R918 Other nonspecific abnormal finding of lung field: Secondary | ICD-10-CM | POA: Diagnosis not present

## 2019-08-31 LAB — BASIC METABOLIC PANEL
Anion gap: 9 (ref 5–15)
BUN: 17 mg/dL (ref 8–23)
CO2: 22 mmol/L (ref 22–32)
Calcium: 9 mg/dL (ref 8.9–10.3)
Chloride: 107 mmol/L (ref 98–111)
Creatinine, Ser: 1.62 mg/dL — ABNORMAL HIGH (ref 0.61–1.24)
GFR calc Af Amer: 49 mL/min — ABNORMAL LOW (ref >60)
GFR calc non Af Amer: 42 mL/min — ABNORMAL LOW (ref >60)
Glucose, Bld: 103 mg/dL — ABNORMAL HIGH (ref 70–99)
Potassium: 4.5 mmol/L (ref 3.5–5.1)
Sodium: 138 mmol/L (ref 135–145)

## 2019-08-31 MED ORDER — SODIUM CHLORIDE 0.9 % WEIGHT BASED INFUSION
3.0000 mL/kg/h | INTRAVENOUS | Status: AC
  Administered 2019-08-31: 11:00:00 3.002 mL/kg/h via INTRAVENOUS

## 2019-08-31 MED ORDER — IOHEXOL 350 MG/ML SOLN
80.0000 mL | Freq: Once | INTRAVENOUS | Status: AC | PRN
  Administered 2019-08-31: 80 mL via INTRAVENOUS

## 2019-08-31 MED ORDER — SODIUM CHLORIDE 0.9 % WEIGHT BASED INFUSION: 1.0000 mL/kg/h | INTRAVENOUS | Status: DC

## 2019-09-01 LAB — NOVEL CORONAVIRUS, NAA (HOSP ORDER, SEND-OUT TO REF LAB; TAT 18-24 HRS): SARS-CoV-2, NAA: NOT DETECTED

## 2019-09-03 ENCOUNTER — Telehealth: Payer: Self-pay

## 2019-09-03 NOTE — Telephone Encounter (Signed)
Follow up   Patient needs further instructions about what medications that he can take before procedure tomorrow. Please call.

## 2019-09-03 NOTE — Telephone Encounter (Signed)
I reviewed medication instructions with the patient and informed him to hold all medications, including Eliquis, the morning of ablation.  He verbalized understanding.

## 2019-09-03 NOTE — Telephone Encounter (Signed)
I spoke to the patient and reviewed Ablation instructions.  He verbalized understanding.

## 2019-09-04 ENCOUNTER — Ambulatory Visit (HOSPITAL_COMMUNITY): Payer: Medicare Other | Admitting: Anesthesiology

## 2019-09-04 ENCOUNTER — Encounter (HOSPITAL_COMMUNITY)
Admission: RE | Disposition: A | Discharge status: Home / Self Care | Payer: Self-pay | Source: Home / Self Care | Attending: Internal Medicine

## 2019-09-04 ENCOUNTER — Encounter (HOSPITAL_COMMUNITY): Payer: Self-pay | Admitting: Internal Medicine

## 2019-09-04 ENCOUNTER — Other Ambulatory Visit: Payer: Self-pay

## 2019-09-04 ENCOUNTER — Ambulatory Visit (HOSPITAL_COMMUNITY)
Admission: RE | Admit: 2019-09-04 | Discharge: 2019-09-04 | Disposition: A | Discharge status: Home / Self Care | Payer: Medicare Other | Attending: Internal Medicine | Admitting: Internal Medicine

## 2019-09-04 DIAGNOSIS — Z886 Allergy status to analgesic agent status: Secondary | ICD-10-CM | POA: Insufficient documentation

## 2019-09-04 DIAGNOSIS — I480 Paroxysmal atrial fibrillation: Secondary | ICD-10-CM | POA: Insufficient documentation

## 2019-09-04 DIAGNOSIS — I2510 Atherosclerotic heart disease of native coronary artery without angina pectoris: Secondary | ICD-10-CM | POA: Insufficient documentation

## 2019-09-04 DIAGNOSIS — Z7982 Long term (current) use of aspirin: Secondary | ICD-10-CM | POA: Insufficient documentation

## 2019-09-04 DIAGNOSIS — Z79899 Other long term (current) drug therapy: Secondary | ICD-10-CM | POA: Insufficient documentation

## 2019-09-04 DIAGNOSIS — Z6831 Body mass index (BMI) 31.0-31.9, adult: Secondary | ICD-10-CM | POA: Diagnosis not present

## 2019-09-04 DIAGNOSIS — Z7901 Long term (current) use of anticoagulants: Secondary | ICD-10-CM | POA: Insufficient documentation

## 2019-09-04 DIAGNOSIS — E669 Obesity, unspecified: Secondary | ICD-10-CM | POA: Diagnosis not present

## 2019-09-04 DIAGNOSIS — Z8249 Family history of ischemic heart disease and other diseases of the circulatory system: Secondary | ICD-10-CM | POA: Diagnosis not present

## 2019-09-04 DIAGNOSIS — G4733 Obstructive sleep apnea (adult) (pediatric): Secondary | ICD-10-CM | POA: Insufficient documentation

## 2019-09-04 DIAGNOSIS — Z880 Allergy status to penicillin: Secondary | ICD-10-CM | POA: Diagnosis not present

## 2019-09-04 DIAGNOSIS — E785 Hyperlipidemia, unspecified: Secondary | ICD-10-CM | POA: Insufficient documentation

## 2019-09-04 HISTORY — PX: ATRIAL FIBRILLATION ABLATION: EP1191

## 2019-09-04 SURGERY — ATRIAL FIBRILLATION ABLATION
Anesthesia: General

## 2019-09-04 MED ORDER — PROPOFOL 10 MG/ML IV BOLUS
INTRAVENOUS | Status: DC | PRN
  Administered 2019-09-04: 100 mg via INTRAVENOUS
  Administered 2019-09-04: 140 mg via INTRAVENOUS

## 2019-09-04 MED ORDER — PANTOPRAZOLE SODIUM 40 MG PO TBEC: 40.0000 mg | DELAYED_RELEASE_TABLET | Freq: Every day | ORAL | 0 refills | Status: DC

## 2019-09-04 MED ORDER — HEPARIN (PORCINE) IN NACL 1000-0.9 UT/500ML-% IV SOLN
INTRAVENOUS | Status: DC | PRN
  Administered 2019-09-04: 500 mL

## 2019-09-04 MED ORDER — SUGAMMADEX SODIUM 200 MG/2ML IV SOLN
INTRAVENOUS | Status: DC | PRN
  Administered 2019-09-04: 200 mg via INTRAVENOUS

## 2019-09-04 MED ORDER — ISOPROTERENOL HCL 0.2 MG/ML IJ SOLN
INTRAVENOUS | Status: DC | PRN
  Administered 2019-09-04: 13:00:00 20 ug/min via INTRAVENOUS

## 2019-09-04 MED ORDER — HYDROCODONE-ACETAMINOPHEN 5-325 MG PO TABS: 1.0000 | ORAL_TABLET | ORAL | Status: DC | PRN

## 2019-09-04 MED ORDER — EPHEDRINE SULFATE-NACL 50-0.9 MG/10ML-% IV SOSY
PREFILLED_SYRINGE | INTRAVENOUS | Status: DC | PRN
  Administered 2019-09-04 (×2): 5 mg via INTRAVENOUS

## 2019-09-04 MED ORDER — PROTAMINE SULFATE 10 MG/ML IV SOLN
INTRAVENOUS | Status: DC | PRN
  Administered 2019-09-04 (×2): 20 mg via INTRAVENOUS

## 2019-09-04 MED ORDER — HEPARIN SODIUM (PORCINE) 1000 UNIT/ML IJ SOLN
INTRAMUSCULAR | Status: DC | PRN
  Administered 2019-09-04: 2000 [IU] via INTRAVENOUS
  Administered 2019-09-04 (×2): 4000 [IU] via INTRAVENOUS
  Administered 2019-09-04: 5000 [IU] via INTRAVENOUS

## 2019-09-04 MED ORDER — PHENYLEPHRINE HCL-NACL 10-0.9 MG/250ML-% IV SOLN
INTRAVENOUS | Status: DC | PRN
  Administered 2019-09-04: 25 ug/min via INTRAVENOUS

## 2019-09-04 MED ORDER — HEPARIN SODIUM (PORCINE) 1000 UNIT/ML IJ SOLN
INTRAMUSCULAR | Status: AC
  Filled 2019-09-04: qty 1

## 2019-09-04 MED ORDER — MIDAZOLAM HCL 5 MG/5ML IJ SOLN
INTRAMUSCULAR | Status: DC | PRN
  Administered 2019-09-04: 2 mg via INTRAVENOUS

## 2019-09-04 MED ORDER — HEPARIN (PORCINE) IN NACL 1000-0.9 UT/500ML-% IV SOLN
INTRAVENOUS | Status: AC
  Filled 2019-09-04: qty 500

## 2019-09-04 MED ORDER — HEPARIN SODIUM (PORCINE) 1000 UNIT/ML IJ SOLN
INTRAMUSCULAR | Status: DC | PRN
  Administered 2019-09-04: 1000 [IU] via INTRAVENOUS
  Administered 2019-09-04: 14000 [IU] via INTRAVENOUS

## 2019-09-04 MED ORDER — SODIUM CHLORIDE 0.9 % IV SOLN: INTRAVENOUS | Status: DC | PRN

## 2019-09-04 MED ORDER — ROCURONIUM BROMIDE 10 MG/ML (PF) SYRINGE
PREFILLED_SYRINGE | INTRAVENOUS | Status: DC | PRN
  Administered 2019-09-04: 100 mg via INTRAVENOUS

## 2019-09-04 MED ORDER — LIDOCAINE 2% (20 MG/ML) 5 ML SYRINGE
INTRAMUSCULAR | Status: DC | PRN
  Administered 2019-09-04: 40 mg via INTRAVENOUS

## 2019-09-04 MED ORDER — SODIUM CHLORIDE 0.9 % IV SOLN: 250.0000 mL | INTRAVENOUS | Status: DC | PRN

## 2019-09-04 MED ORDER — DEXAMETHASONE SODIUM PHOSPHATE 10 MG/ML IJ SOLN
INTRAMUSCULAR | Status: DC | PRN
  Administered 2019-09-04: 10 mg via INTRAVENOUS

## 2019-09-04 MED ORDER — ONDANSETRON HCL 4 MG/2ML IJ SOLN
INTRAMUSCULAR | Status: DC | PRN
  Administered 2019-09-04: 4 mg via INTRAVENOUS

## 2019-09-04 MED ORDER — SODIUM CHLORIDE 0.9% FLUSH: 3.0000 mL | Freq: Two times a day (BID) | INTRAVENOUS | Status: DC

## 2019-09-04 MED ORDER — ISOPROTERENOL HCL 0.2 MG/ML IJ SOLN
INTRAMUSCULAR | Status: AC
  Filled 2019-09-04: qty 5

## 2019-09-04 MED ORDER — FENTANYL CITRATE (PF) 100 MCG/2ML IJ SOLN
INTRAMUSCULAR | Status: DC | PRN
  Administered 2019-09-04 (×2): 25 ug via INTRAVENOUS

## 2019-09-04 SURGICAL SUPPLY — 20 items
BLANKET WARM UNDERBOD FULL ACC (MISCELLANEOUS) ×3 IMPLANT
CATH MAPPNG PENTARAY F 2-6-2MM (CATHETERS) IMPLANT
CATH SMTCH THERMOCOOL SF DF (CATHETERS) ×2 IMPLANT
CATH SOUNDSTAR ECO 8FR (CATHETERS) ×2 IMPLANT
CATH WEBSTER BI DIR CS D-F CRV (CATHETERS) ×2 IMPLANT
COVER SWIFTLINK CONNECTOR (BAG) ×3 IMPLANT
DEVICE CLOSURE PERCLS PRGLD 6F (VASCULAR PRODUCTS) IMPLANT
NDL BAYLIS TRANSSEPTAL 71CM (NEEDLE) IMPLANT
NEEDLE BAYLIS TRANSSEPTAL 71CM (NEEDLE) ×3 IMPLANT
PACK EP LATEX FREE (CUSTOM PROCEDURE TRAY) ×3
PACK EP LF (CUSTOM PROCEDURE TRAY) ×1 IMPLANT
PAD PRO RADIOLUCENT 2001M-C (PAD) ×3 IMPLANT
PATCH CARTO3 (PAD) ×2 IMPLANT
PENTARAY F 2-6-2MM (CATHETERS) ×3
PERCLOSE PROGLIDE 6F (VASCULAR PRODUCTS) ×9
SHEATH PINNACLE 7F 10CM (SHEATH) ×4 IMPLANT
SHEATH PINNACLE 9F 10CM (SHEATH) ×2 IMPLANT
SHEATH PROBE COVER 6X72 (BAG) ×2 IMPLANT
SHEATH SWARTZ TS SL2 63CM 8.5F (SHEATH) ×2 IMPLANT
TUBING SMART ABLATE COOLFLOW (TUBING) ×2 IMPLANT

## 2019-09-04 NOTE — Interval H&P Note (Signed)
History and Physical Interval Note:  09/04/2019 9:17 AM  Ronald Davis  has presented today for surgery, with the diagnosis of afib.  The various methods of treatment have been discussed with the patient and family. After consideration of risks, benefits and other options for treatment, the patient has consented to  Procedure(s): ATRIAL FIBRILLATION ABLATION (N/A) as a surgical intervention.  The patient's history has been reviewed, patient examined, no change in status, stable for surgery.  I have reviewed the patient's chart and labs.  Questions were answered to the patient's satisfaction.    He reports compliance with eliquis without interruption.  Cardiac CT reviewed with the patient today.  Thompson Grayer

## 2019-09-04 NOTE — Progress Notes (Signed)
Up and walked and tolerated well; right groin stable, no bleeding or hematoma 

## 2019-09-04 NOTE — Anesthesia Preprocedure Evaluation (Signed)
Anesthesia Evaluation  Patient identified by MRN, date of birth, ID band Patient awake    Reviewed: Allergy & Precautions, NPO status , Patient's Chart, lab work & pertinent test results  Airway Mallampati: II  TM Distance: >3 FB Neck ROM: Full    Dental  (+) Chipped,    Pulmonary sleep apnea ,    Pulmonary exam normal        Cardiovascular + CAD  + dysrhythmias Atrial Fibrillation  Rhythm:Irregular Rate:Tachycardia  ECG: a-fib with RVR, rate 103   Neuro/Psych negative neurological ROS  negative psych ROS   GI/Hepatic negative GI ROS, Neg liver ROS,   Endo/Other  negative endocrine ROS  Renal/GU negative Renal ROS     Musculoskeletal negative musculoskeletal ROS (+)   Abdominal (+) + obese,   Peds  Hematology HLD   Anesthesia Other Findings persistent atrial fibrillation with RVR  Reproductive/Obstetrics                             Anesthesia Physical  Anesthesia Plan  ASA: IV  Anesthesia Plan: General   Post-op Pain Management:    Induction: Intravenous  PONV Risk Score and Plan: 2 and Propofol infusion and Treatment may vary due to age or medical condition  Airway Management Planned: Oral ETT and LMA  Additional Equipment:   Intra-op Plan:   Post-operative Plan: Extubation in OR  Informed Consent: I have reviewed the patients History and Physical, chart, labs and discussed the procedure including the risks, benefits and alternatives for the proposed anesthesia with the patient or authorized representative who has indicated his/her understanding and acceptance.     Dental advisory given  Plan Discussed with: CRNA, Anesthesiologist and Surgeon  Anesthesia Plan Comments: (  )        Anesthesia Quick Evaluation

## 2019-09-04 NOTE — Progress Notes (Signed)
Dr Rayann Heman in and okay to d/c at 1730

## 2019-09-04 NOTE — Discharge Instructions (Signed)
Cardiac Ablation, Care After This sheet gives you information about how to care for yourself after your procedure. Your health care provider may also give you more specific instructions. If you have problems or questions, contact your health care provider. What can I expect after the procedure? After the procedure, it is common to have:  Bruising around your puncture site.  Tenderness around your puncture site.  Skipped heartbeats.  Tiredness (fatigue). Follow these instructions at home: Puncture site care   Follow instructions from your health care provider about how to take care of your puncture site. Make sure you: ? Wash your hands with soap and water before you change your bandage (dressing). If soap and water are not available, use hand sanitizer. ? Change your dressing as told by your health care provider. ? Leave stitches (sutures), skin glue, or adhesive strips in place. These skin closures may need to stay in place for up to 2 weeks. If adhesive strip edges start to loosen and curl up, you may trim the loose edges. Do not remove adhesive strips completely unless your health care provider tells you to do that.  Check your puncture site every day for signs of infection. Check for: ? Redness, swelling, or pain. ? Fluid or blood. If your puncture site starts to bleed, lie down on your back, apply firm pressure to the area, and contact your health care provider. ? Warmth. ? Pus or a bad smell. Driving  Ask your health care provider when it is safe for you to drive again after the procedure.  Do not drive or use heavy machinery while taking prescription pain medicine.  Do not drive for 24 hours if you were given a medicine to help you relax (sedative) during your procedure. Activity  Avoid activities that take a lot of effort for at least 3 days after your procedure.  Do not lift anything that is heavier than 10 lb (4.5 kg), or the limit that you are told, until your health  care provider says that it is safe.  Return to your normal activities as told by your health care provider. Ask your health care provider what activities are safe for you. General instructions  Take over-the-counter and prescription medicines only as told by your health care provider.  Do not use any products that contain nicotine or tobacco, such as cigarettes and e-cigarettes. If you need help quitting, ask your health care provider.  Do not take baths, swim, or use a hot tub until your health care provider approves.  Do not drink alcohol for 24 hours after your procedure.  Keep all follow-up visits as told by your health care provider. This is important. Contact a health care provider if:  You have redness, mild swelling, or pain around your puncture site.  You have fluid or blood coming from your puncture site that stops after applying firm pressure to the area.  Your puncture site feels warm to the touch.  You have pus or a bad smell coming from your puncture site.  You have a fever.  You have chest pain or discomfort that spreads to your neck, jaw, or arm.  You are sweating a lot.  You feel nauseous.  You have a fast or irregular heartbeat.  You have shortness of breath.  You are dizzy or light-headed and feel the need to lie down.  You have pain or numbness in the arm or leg closest to your puncture site. Get help right away if:  Your puncture  site suddenly swells.  Your puncture site is bleeding and the bleeding does not stop after applying firm pressure to the area. These symptoms may represent a serious problem that is an emergency. Do not wait to see if the symptoms will go away. Get medical help right away. Call your local emergency services (911 in the [place] Do not drive yourself to the hospital. Summary  After the procedure, it is normal to have bruising and tenderness at the puncture site in your groin, neck, or forearm.  Check your puncture site every  day for signs of infection.  Get help right away if your puncture site is bleeding and the bleeding does not stop after applying firm pressure to the area. This is a medical emergency. This information is not intended to replace advice given to you by your health care provider. Make sure you discuss any questions you have with your health care provider. Document Revised: 07/15/2017 Document Reviewed: 11/11/2016 Elsevier Patient Education  [place]. Femoral Site Care This sheet gives you information about how to care for yourself after your procedure. Your health care provider may also give you more specific instructions. If you have problems or questions, contact your health care provider. What can I expect after the procedure? After the procedure, it is common to have:  Bruising that usually fades within 1-2 weeks.  Tenderness at the site. Follow these instructions at home: Wound care  Follow instructions from your health care provider about how to take care of your insertion site. Make sure you: ? Wash your hands with soap and water before you change your bandage (dressing). If soap and water are not available, use hand sanitizer. ? Change your dressing as told by your health care provider. ? Leave stitches (sutures), skin glue, or adhesive strips in place. These skin closures may need to stay in place for 2 weeks or longer. If adhesive strip edges start to loosen and curl up, you may trim the loose edges. Do not remove adhesive strips completely unless your health care provider tells you to do that.  Do not take baths, swim, or use a hot tub until your health care provider approves.  You may shower 24-48 hours after the procedure or as told by your health care provider. ? Gently wash the site with plain soap and water. ? Pat the area dry with a clean towel. ? Do not rub the site. This may cause bleeding.  Do not apply powder or lotion to the site. Keep the site clean and  dry.  Check your femoral site every day for signs of infection. Check for: ? Redness, swelling, or pain. ? Fluid or blood. ? Warmth. ? Pus or a bad smell. Activity  For the first 2-3 days after your procedure, or as long as directed: ? Avoid climbing stairs as much as possible. ? Do not squat.  Do not lift anything that is heavier than 10 lb (4.5 kg), or the limit that you are told, until your health care provider says that it is safe.  Rest as directed. ? Avoid sitting for a long time without moving. Get up to take short walks every 1-2 hours.  Do not drive for 24 hours if you were given a medicine to help you relax (sedative). General instructions  Take over-the-counter and prescription medicines only as told by your health care provider.  Keep all follow-up visits as told by your health care provider. This is important. Contact a health care provider  if you have:  A fever or chills.  You have redness, swelling, or pain around your insertion site. Get help right away if:  The catheter insertion area swells very fast.  You pass out.  You suddenly start to sweat or your skin gets clammy.  The catheter insertion area is bleeding, and the bleeding does not stop when you hold steady pressure on the area.  The area near or just beyond the catheter insertion site becomes pale, cool, tingly, or numb. These symptoms may represent a serious problem that is an emergency. Do not wait to see if the symptoms will go away. Get medical help right away. Call your local emergency services (911 in the [place] Do not drive yourself to the hospital. Summary  After the procedure, it is common to have bruising that usually fades within 1-2 weeks.  Check your femoral site every day for signs of infection.  Do not lift anything that is heavier than 10 lb (4.5 kg), or the limit that you are told, until your health care provider says that it is safe. This information is not intended to replace  advice given to you by your health care provider. Make sure you discuss any questions you have with your health care provider. Document Revised: 08/15/2017 Document Reviewed: 08/15/2017 Elsevier Patient Education  2020 [place] procedure care instructions No driving for 4 days. No lifting over 5 lbs for 1 week. No vigorous or sexual activity for 1 week. You may return to work/your usual activities on 09/11/2019. Keep procedure site clean & dry. If you notice increased pain, swelling, bleeding or pus, call/return!  You may shower, but no soaking baths/hot tubs/pools for 1 week.     You have an appointment set up with the [facility].  Multiple studies have shown that being followed by a dedicated atrial fibrillation clinic in addition to the standard care you receive from your other physicians improves health. We believe that enrollment in the atrial fibrillation clinic will allow [place] to better care for you.   The phone number to the [facility] is [phone]. The clinic is staffed Monday through Friday from 8:30am to 5pm.  Parking Directions: The clinic is located in the Heart and Vascular Building connected to [facility]. 1)From [address] turn on to [address] and go to the 3rd entrance  (Heart and Vascular entrance) on the right. 2)Look to the right for Heart &Vascular Parking Garage. 3)A code for the entrance is required please call the clinic to receive this.   4)Take the elevators to the 1st floor. Registration is in the room with the glass walls at the end of the hallway.  If you have any trouble parking or locating the clinic, please dont hesitate to call [phone].

## 2019-09-04 NOTE — Transfer of Care (Signed)
Immediate Anesthesia Transfer of Care Note  Patient: Ronald Davis  Procedure(s) Performed: ATRIAL FIBRILLATION ABLATION (N/A )  Patient Location: Cath Lab  Anesthesia Type:General  Level of Consciousness: awake, alert  and oriented  Airway & Oxygen Therapy: Patient Spontanous Breathing and Patient connected to nasal cannula oxygen  Post-op Assessment: Report given to RN, Post -op Vital signs reviewed and stable and Patient moving all extremities  Post vital signs: Reviewed and stable  Last Vitals:  Vitals Value Taken Time  BP 143/69 09/04/19 1333  Temp    Pulse 62 09/04/19 1336  Resp 14 09/04/19 1336  SpO2 96 % 09/04/19 1336  Vitals shown include unvalidated device data.  Last Pain:  Vitals:   09/04/19 0929  TempSrc:   PainSc: 0-No pain      Patients Stated Pain Goal: 3 ([id number])  Complications: No apparent anesthesia complications

## 2019-09-04 NOTE — Anesthesia Procedure Notes (Signed)
Procedure Name: Intubation Date/Time: 09/04/2019 10:12 AM Performed by: Kyung Rudd, CRNA Pre-anesthesia Checklist: Patient identified, Emergency Drugs available, Suction available and Patient being monitored Patient Re-evaluated:Patient Re-evaluated prior to induction Oxygen Delivery Method: Circle system utilized Preoxygenation: Pre-oxygenation with 100% oxygen Induction Type: IV induction Ventilation: Mask ventilation without difficulty Laryngoscope Size: Mac and 4 Grade View: Grade I Tube type: Oral Tube size: 7.5 mm Number of attempts: 1 Airway Equipment and Method: Stylet Placement Confirmation: ETT inserted through vocal cords under direct vision,  positive ETCO2 and breath sounds checked- equal and bilateral Secured at: 22 cm Tube secured with: Tape Dental Injury: Teeth and Oropharynx as per pre-operative assessment

## 2019-09-04 NOTE — Anesthesia Postprocedure Evaluation (Signed)
Anesthesia Post Note  Patient: GOKUL ARRANT  Procedure(s) Performed: ATRIAL FIBRILLATION ABLATION (N/A )     Patient location during evaluation: PACU Anesthesia Type: General Level of consciousness: awake and alert Pain management: pain level controlled Vital Signs Assessment: post-procedure vital signs reviewed and stable Respiratory status: spontaneous breathing, nonlabored ventilation, respiratory function stable and patient connected to nasal cannula oxygen Cardiovascular status: blood pressure returned to baseline and stable Postop Assessment: no apparent nausea or vomiting Anesthetic complications: no    Last Vitals:  Vitals:   09/04/19 1700 09/04/19 1718  BP: (!) 145/58   Pulse: 79 75  Resp: 14 20  Temp:    SpO2: 94% 95%    Last Pain:  Vitals:   09/04/19 1405  TempSrc: Temporal  PainSc: 0-No pain                 Rockey Guarino

## 2019-09-05 LAB — POCT ACTIVATED CLOTTING TIME
Activated Clotting Time: 235 seconds
Activated Clotting Time: 268 seconds
Activated Clotting Time: 285 seconds
Activated Clotting Time: 301 seconds

## 2019-09-14 ENCOUNTER — Ambulatory Visit: Payer: Medicare Other

## 2019-09-22 ENCOUNTER — Ambulatory Visit: Payer: Medicare Other | Attending: Internal Medicine

## 2019-09-22 DIAGNOSIS — Z23 Encounter for immunization: Secondary | ICD-10-CM | POA: Insufficient documentation

## 2019-09-22 NOTE — Progress Notes (Signed)
   Covid-19 Vaccination Clinic  Name:  Ronald Davis    MRN: [id number] DOB: Apr 07, 1951  09/22/2019  [name] was observed post Covid-19 immunization for 15 minutes without incidence. He was provided with Vaccine Information Sheet and instruction to access the V-Safe system.   [name] was instructed to call 911 with any severe reactions post vaccine: Marland Kitchen Difficulty breathing  . Swelling of your face and throat  . A fast heartbeat  . A bad rash all over your body  . Dizziness and weakness    Immunizations Administered    Name Date Dose VIS Date Route   Pfizer COVID-19 Vaccine 09/22/2019  5:06 PM 0.3 mL 07/27/2019 Intramuscular   Manufacturer: [place]   Lot: [id number]   [place]: [id number]

## 2019-10-02 ENCOUNTER — Ambulatory Visit (HOSPITAL_COMMUNITY)
Admission: RE | Admit: 2019-10-02 | Discharge: 2019-10-02 | Disposition: A | Discharge status: Home / Self Care | Payer: Medicare Other | Source: Ambulatory Visit | Attending: Physician Assistant | Admitting: Physician Assistant

## 2019-10-02 ENCOUNTER — Other Ambulatory Visit: Payer: Self-pay

## 2019-10-02 VITALS — BP 116/60 | HR 59 | Ht 73.0 in | Wt 241.6 lb

## 2019-10-02 DIAGNOSIS — Z823 Family history of stroke: Secondary | ICD-10-CM | POA: Diagnosis not present

## 2019-10-02 DIAGNOSIS — Z880 Allergy status to penicillin: Secondary | ICD-10-CM | POA: Diagnosis not present

## 2019-10-02 DIAGNOSIS — D6869 Other thrombophilia: Secondary | ICD-10-CM

## 2019-10-02 DIAGNOSIS — Z886 Allergy status to analgesic agent status: Secondary | ICD-10-CM | POA: Diagnosis not present

## 2019-10-02 DIAGNOSIS — E669 Obesity, unspecified: Secondary | ICD-10-CM | POA: Diagnosis not present

## 2019-10-02 DIAGNOSIS — I2510 Atherosclerotic heart disease of native coronary artery without angina pectoris: Secondary | ICD-10-CM | POA: Diagnosis not present

## 2019-10-02 DIAGNOSIS — G4733 Obstructive sleep apnea (adult) (pediatric): Secondary | ICD-10-CM | POA: Diagnosis not present

## 2019-10-02 DIAGNOSIS — I480 Paroxysmal atrial fibrillation: Secondary | ICD-10-CM | POA: Diagnosis present

## 2019-10-02 DIAGNOSIS — Z6831 Body mass index (BMI) 31.0-31.9, adult: Secondary | ICD-10-CM | POA: Insufficient documentation

## 2019-10-02 DIAGNOSIS — E785 Hyperlipidemia, unspecified: Secondary | ICD-10-CM | POA: Insufficient documentation

## 2019-10-02 DIAGNOSIS — Z7901 Long term (current) use of anticoagulants: Secondary | ICD-10-CM | POA: Diagnosis not present

## 2019-10-02 DIAGNOSIS — Z79899 Other long term (current) drug therapy: Secondary | ICD-10-CM | POA: Diagnosis not present

## 2019-10-02 DIAGNOSIS — Z8249 Family history of ischemic heart disease and other diseases of the circulatory system: Secondary | ICD-10-CM | POA: Insufficient documentation

## 2019-10-02 NOTE — Progress Notes (Signed)
Primary Care Physician: Aletha Halim., PA-C Primary Cardiologist: Dr Audie Box Primary Electrophysiologist: none Referring Physician: Almyra Deforest PA   Ronald Davis is a 69 y.o. male with a history of coronary calcification, obstructive sleep apnea s/p tonsillectomy/uvulectomy and rhinoplasty, hyperlipidemia and persistent atrial fibrillation who presents for consultation in the [facility].  The patient was initially diagnosed with atrial fibrillation in October 2020 by his primary care provider who later referred him to [facility].  Dr. Virgina Jock started him on Eliquis for a CHADS2VASC score of 2 and Cardizem.  It was recommended for him to proceed with cardioversion, he later saw Dr. Audie Box on 07/03/2019 for second opinion.  His symptom has improved after starting on the diltiazem.  Initially Dr. Audie Box recommended him to proceed with cardioversion recommended by Dr. Virgina Jock, however later patient establish wished to stay with Dr. Audie Box. He underwent TEE/DCCV on 07/20/2019. Unfortunately, patient was back in afib with symptoms of dizziness, exercise intolerance, and generalized weakness. He is unsure when he converted. He does have a history of alcohol use but has not had any drinks recently.   On follow up today, patient is s/p afib ablation on 09/04/19. Patient reports that he did have two episodes of heart racing post ablation which lasted about 15 minutes each and then resolved. He denies CP, swallowing or groin issues.  Today, he denies symptoms of chest pain, shortness of breath, orthopnea, PND, lower extremity edema, presyncope, syncope, snoring, daytime somnolence, bleeding, or neurologic sequela. The patient is tolerating medications without difficulties and is otherwise without complaint today.   Atrial Fibrillation Risk Factors:  he does have symptoms or diagnosis of sleep apnea. he is not on CPAP therapy. S/p surgery.  he does not have a  history of rheumatic fever. he does have a history of alcohol use. The patient does have a history of early familial atrial fibrillation or other arrhythmias. Father and brother have afib.  he has a BMI of Body mass index is 31.88 kg/m.Marland Kitchen Filed Weights   10/02/19 1132  Weight: 109.6 kg    Family History  Problem Relation Age of Onset  . Colon cancer Maternal Grandmother   . Stroke Mother   . Prostate cancer Father   . CAD Father        CABG  . Stroke Father   . Colon polyps Sister   . Colon polyps Brother   . Colon polyps Sister   . Colon polyps Sister   . Colon polyps Sister   . Colon polyps Brother      Atrial Fibrillation Management history:  Previous antiarrhythmic drugs: amiodarone Previous cardioversions: 07/20/19 Previous ablations: 09/04/19 CHADS2VASC score: 2 Anticoagulation history: Eliquis   Past Medical History:  Diagnosis Date  . Allergy   . Coronary artery disease   . Hyperlipidemia   . Low BP   . Sleep apnea    Past Surgical History:  Procedure Laterality Date  . ATRIAL FIBRILLATION ABLATION N/A 09/04/2019   Procedure: ATRIAL FIBRILLATION ABLATION;  Surgeon: Thompson Grayer, MD;  Location: [facility];  Service: Cardiovascular;  Laterality: N/A;  . CARDIOVERSION N/A 07/20/2019   Procedure: CARDIOVERSION;  Surgeon: Fay Records, MD;  Location: [facility] ENDOSCOPY;  Service: Cardiovascular;  Laterality: N/A;  . COLONOSCOPY    . ears     ears pinned back  . INGUINAL HERNIA REPAIR     right  . MOLE REMOVAL    . POLYPECTOMY    . RHINOPLASTY  for sleep apnea  . TEE WITHOUT CARDIOVERSION N/A 07/20/2019   Procedure: TRANSESOPHAGEAL ECHOCARDIOGRAM (TEE);  Surgeon: Fay Records, MD;  Location[facility] ENDOSCOPY;  Service: Cardiovascular;  Laterality: N/A;  . tonisllectomy      Current Outpatient Medications  Medication Sig Dispense Refill  . apixaban (ELIQUIS) 5 MG TABS tablet Take 1 tablet (5 mg total) by mouth 2 (two) times daily. 180 tablet 3  .  clobetasol cream (TEMOVATE) [id number] % Apply 1 application topically at bedtime as needed (rash).    Marland Kitchen diltiazem (CARDIZEM CD) 120 MG 24 hr capsule Take 1 capsule (120 mg total) by mouth daily. 30 capsule 11  . ibuprofen (ADVIL) 200 MG tablet Take 400 mg by mouth as needed (for pain).     . pantoprazole (PROTONIX) 40 MG tablet Take 1 tablet (40 mg total) by mouth daily. 45 tablet 0  . rosuvastatin (CRESTOR) 20 MG tablet Take 1 tablet (20 mg total) by mouth at bedtime. 30 tablet 11  . tamsulosin (FLOMAX) 0.4 MG CAPS capsule Take 0.4 mg by mouth daily.     . traZODone (DESYREL) 50 MG tablet Take 1 tablet (50 mg total) by mouth at bedtime. 90 tablet 3   No current facility-administered medications for this encounter.    Allergies  Allergen Reactions  . Naproxen Other (See Comments)    GI bleed  . Amoxicillin Diarrhea    Did it involve swelling of the face/tongue/throat, SOB, or low BP? No Did it involve sudden or severe rash/hives, skin peeling, or any reaction on the inside of your mouth or nose? No Did you need to seek medical attention at a hospital or doctor's office? No When did it last happen?more than 15 years If all above answers are "NO", may proceed with cephalosporin use.   . Tomato Other (See Comments)    Eczema, with red tomatoes only     Social History   Socioeconomic History  . Marital status: Married    Spouse name: Not on file  . Number of children: 0  . Years of education: Not on file  . Highest education level: Not on file  Occupational History  . Not on file  Tobacco Use  . Smoking status: Never Smoker  . Smokeless tobacco: Never Used  Substance and Sexual Activity  . Alcohol use: Yes    Comment: Rarely-   . Drug use: No  . Sexual activity: Not on file  Other Topics Concern  . Not on file  Social History Narrative   Forensic psychologist, retired    Investment banker, operational of Radio broadcast assistant Strain:   . Difficulty of Paying Living Expenses: Not  on file  Food Insecurity:   . Worried About Charity fundraiser in the Last Year: Not on file  . Ran Out of Food in the Last Year: Not on file  Transportation Needs:   . Lack of Transportation (Medical): Not on file  . Lack of Transportation (Non-Medical): Not on file  Physical Activity:   . Days of Exercise per Week: Not on file  . Minutes of Exercise per Session: Not on file  Stress:   . Feeling of Stress : Not on file  Social Connections:   . Frequency of Communication with Friends and Family: Not on file  . Frequency of Social Gatherings with Friends and Family: Not on file  . Attends Religious Services: Not on file  . Active Member of Clubs or Organizations: Not on file  .  Attends Archivist Meetings: Not on file  . Marital Status: Not on file  Intimate Partner Violence:   . Fear of Current or Ex-Partner: Not on file  . Emotionally Abused: Not on file  . Physically Abused: Not on file  . Sexually Abused: Not on file     ROS- All systems are reviewed and negative except as per the HPI above.  Physical Exam: Vitals:   10/02/19 1132  BP: 116/60  Pulse: (!) 59  Weight: 109.6 kg  Height: 6\' 1"  (1.854 m)    GEN- The patient is well appearing obese male, alert and oriented x 3 today.   HEENT-head normocephalic, atraumatic, sclera clear, conjunctiva pink, hearing intact, trachea midline. Lungs- Clear to ausculation bilaterally, normal work of breathing Heart- Regular rate and rhythm, no murmurs, rubs or gallops  GI- soft, NT, ND, + BS Extremities- no clubbing, cyanosis, or edema MS- no significant deformity or atrophy Skin- no rash or lesion Psych- euthymic mood, full affect Neuro- strength and sensation are intact   Wt Readings from Last 3 Encounters:  10/02/19 109.6 kg  09/04/19 105.7 kg  08/22/19 106.6 kg    EKG today demonstrates SR HR 59, PVC, PR 152, QRS 88, QTc 415  Echo 07/24/19 demonstrated  1. Left ventricular ejection fraction, by visual  estimation, is 60 to 65%. The left ventricle has normal function. There is no left ventricular hypertrophy.  2. Left ventricular diastolic function could not be evaluated.  3. Global right ventricle has normal systolic function.The right ventricular size is normal. No increase in right ventricular wall thickness.  4. Left atrial size was normal.  5. Right atrial size was normal.  6. The mitral valve is normal in structure. Mild mitral valve regurgitation. No evidence of mitral stenosis.  7. The tricuspid valve is normal in structure. Tricuspid valve regurgitation is mild.  8. The aortic valve is normal in structure. Aortic valve regurgitation is not visualized. Mild to moderate aortic valve sclerosis/calcification without any evidence of aortic stenosis.  9. The pulmonic valve was normal in structure. Pulmonic valve regurgitation is not visualized. 10. Normal pulmonary artery systolic pressure. 11. The inferior vena cava is normal in size with greater than 50% respiratory variability, suggesting right atrial pressure of 3 mmHg.  LA 3.3 cm  Epic records are reviewed at length today  Assessment and Plan:  1. Paroxysmal atrial fibrillation S/p TEE/DCCV on 07/20/19 with ERAF. Patient spontaneously converted back to SR. S/p afib ablation 09/04/19 with Dr Rayann Heman Patient appears to be maintaining SR off AAD. Amiodarone stopped post procedure. Continue Eliquis 5 mg BID for at least 3 months post ablation with no missed doses.  Continue diltiazem 120 mg daily  This patients CHA2DS2-VASc Score and unadjusted Ischemic Stroke Rate (% per year) is equal to 2.2 % stroke rate/year from a score of 2  Above score calculated as 1 point each if present [CHF, HTN, DM, Vascular=MI/PAD/Aortic Plaque, Age if 65-74, or Male] Above score calculated as 2 points each if present [Age > 75, or Stroke/TIA/TE]   2. Obesity Body mass index is 31.88 kg/m. Lifestyle modification was discussed and encouraged  including regular physical activity and weight reduction.  3. Obstructive sleep apnea The importance of adequate treatment of sleep apnea was discussed today in order to improve our ability to maintain sinus rhythm long term. S/p tonsillectomy/uvulectomy and rhinoplasty.   Follow up with Dr Audie Box and Dr Rayann Heman as scheduled.     Adline Peals PA-C Afib Clinic  [facility] [address] [place], [place] [address] [phone] 10/02/2019 11:59 AM

## 2019-10-11 ENCOUNTER — Other Ambulatory Visit: Payer: Self-pay | Admitting: Internal Medicine

## 2019-10-14 DIAGNOSIS — I4891 Unspecified atrial fibrillation: Secondary | ICD-10-CM

## 2019-10-14 DIAGNOSIS — R931 Abnormal findings on diagnostic imaging of heart and coronary circulation: Secondary | ICD-10-CM

## 2019-10-17 ENCOUNTER — Ambulatory Visit: Payer: Medicare Other | Attending: Internal Medicine

## 2019-10-17 DIAGNOSIS — Z23 Encounter for immunization: Secondary | ICD-10-CM | POA: Insufficient documentation

## 2019-10-17 MED ORDER — DILTIAZEM HCL ER COATED BEADS 120 MG PO CP24: 120.0000 mg | ORAL_CAPSULE | Freq: Every day | ORAL | 3 refills | Status: DC

## 2019-10-17 MED ORDER — ROSUVASTATIN CALCIUM 20 MG PO TABS: 20.0000 mg | ORAL_TABLET | Freq: Every day | ORAL | 3 refills | Status: DC

## 2019-10-17 NOTE — Progress Notes (Signed)
   Covid-19 Vaccination Clinic  Name:  Ronald Davis    MRN: [id number] DOB: 03-Apr-1951  10/17/2019  [name] was observed post Covid-19 immunization for 15 minutes without incident. He was provided with Vaccine Information Sheet and instruction to access the V-Safe system.   [name] was instructed to call 911 with any severe reactions post vaccine: Marland Kitchen Difficulty breathing  . Swelling of face and throat  . A fast heartbeat  . A bad rash all over body  . Dizziness and weakness   Immunizations Administered    Name Date Dose VIS Date Route   Pfizer COVID-19 Vaccine 10/17/2019  2:15 PM 0.3 mL 07/27/2019 Intramuscular   Manufacturer: [place]   Lot: [id number]   [place]: [id number]

## 2019-10-21 NOTE — Progress Notes (Signed)
Cardiology Office Note:   Date:  10/22/2019  NAME:  Ronald Davis    MRN: [id number] DOB:  25-Nov-1950   PCP:  Aletha Halim., PA-C  Cardiologist:  Evalina Field, MD    Referring MD: Aletha Halim., PA-C   Chief Complaint  Patient presents with  . Atrial Fibrillation   History of Present Illness:   Ronald Davis is a 69 y.o. male with a hx of CAD, persistent Afib s/p ablation who presents for follow-up. Afib ablation 09/04/2019.  He reports he is doing well since his ablation.  He reports 1-2 episodes of palpitations that were short-lived.  They lasted seconds to minutes.  He thinks they were likely recurrence of atrial fibrillation that resolved without intervention.  Is been maintained on diltiazem extended release tablet 120 mg daily.  He is not missing any Eliquis doses.  No bleeding issues reported.  He reports he has been a little more fatigue than usual.  He is try to do several activities such as removing trees from his lawn and had profound fatigue with this.  He also reports some dizziness upon standing.  He has not been maintaining adequate hydration per his report.  I also discussed with him the need for repeat lipid profile.  He reports his diet has not been that great.  He is not lost any weight.  He has cut back on alcohol consumption.  He seems to be doing well overall.  Denies any chest pain or shortness of breath today.  Problem List 1. Persistent Atrial Fibrillation  -TEE/DCCV 07/20/2019 -recurrence Afib 12/9 -ablation 09/04/2019 -on eliquis  2. CAD  -CAC 946 2021 -negative NM MPI 2015  Past Medical History: Past Medical History:  Diagnosis Date  . Allergy   . Coronary artery disease   . Hyperlipidemia   . Low BP   . Sleep apnea     Past Surgical History: Past Surgical History:  Procedure Laterality Date  . ATRIAL FIBRILLATION ABLATION N/A 09/04/2019   Procedure: ATRIAL FIBRILLATION ABLATION;  Surgeon: Thompson Grayer, MD;  Location: [place] [facility];   Service: Cardiovascular;  Laterality: N/A;  . CARDIOVERSION N/A 07/20/2019   Procedure: CARDIOVERSION;  Surgeon: Fay Records, MD;  Location: [facility] ENDOSCOPY;  Service: Cardiovascular;  Laterality: N/A;  . COLONOSCOPY    . ears     ears pinned back  . INGUINAL HERNIA REPAIR     right  . MOLE REMOVAL    . POLYPECTOMY    . RHINOPLASTY      for sleep apnea  . TEE WITHOUT CARDIOVERSION N/A 07/20/2019   Procedure: TRANSESOPHAGEAL ECHOCARDIOGRAM (TEE);  Surgeon: Fay Records, MD;  Location: [facility] ENDOSCOPY;  Service: Cardiovascular;  Laterality: N/A;  . tonisllectomy      Current Medications: Current Meds  Medication Sig  . apixaban (ELIQUIS) 5 MG TABS tablet Take 1 tablet (5 mg total) by mouth 2 (two) times daily.  . clobetasol cream (TEMOVATE) [id number] % Apply 1 application topically at bedtime as needed (rash).  . rosuvastatin (CRESTOR) 20 MG tablet Take 1 tablet (20 mg total) by mouth at bedtime.  . tamsulosin (FLOMAX) 0.4 MG CAPS capsule Take 0.4 mg by mouth daily.   . traZODone (DESYREL) 50 MG tablet Take 1 tablet (50 mg total) by mouth at bedtime.  . [DISCONTINUED] diltiazem (CARDIZEM CD) 120 MG 24 hr capsule Take 1 capsule (120 mg total) by mouth daily.  . [DISCONTINUED] traZODone (DESYREL) 50 MG tablet Take 1 tablet (  50 mg total) by mouth at bedtime.     Allergies:    Naproxen, Amoxicillin, and Tomato   Social History: Social History   Socioeconomic History  . Marital status: Married    Spouse name: Not on file  . Number of children: 0  . Years of education: Not on file  . Highest education level: Not on file  Occupational History  . Not on file  Tobacco Use  . Smoking status: Never Smoker  . Smokeless tobacco: Never Used  Substance and Sexual Activity  . Alcohol use: Yes    Comment: Rarely-   . Drug use: No  . Sexual activity: Not on file  Other Topics Concern  . Not on file  Social History Narrative   Forensic psychologist, retired    Investment banker, operational of Adult nurse Strain:   . Difficulty of Paying Living Expenses: Not on file  Food Insecurity:   . Worried About Charity fundraiser in the Last Year: Not on file  . Ran Out of Food in the Last Year: Not on file  Transportation Needs:   . Lack of Transportation (Medical): Not on file  . Lack of Transportation (Non-Medical): Not on file  Physical Activity:   . Days of Exercise per Week: Not on file  . Minutes of Exercise per Session: Not on file  Stress:   . Feeling of Stress : Not on file  Social Connections:   . Frequency of Communication with Friends and Family: Not on file  . Frequency of Social Gatherings with Friends and Family: Not on file  . Attends Religious Services: Not on file  . Active Member of Clubs or Organizations: Not on file  . Attends Archivist Meetings: Not on file  . Marital Status: Not on file     Family History: The patient's family history includes CAD in his father; Colon cancer in his maternal grandmother; Colon polyps in his brother, brother, sister, sister, sister, and sister; Prostate cancer in his father; Stroke in his father and mother.  ROS:   All other ROS reviewed and negative. Pertinent positives noted in the HPI.     EKGs/Labs/Other Studies Reviewed:   The following studies were personally reviewed by me today:  Recent Labs: 08/27/2019: Hemoglobin 13.4; Platelets 229 08/31/2019: BUN 17; Creatinine, Ser 1.62; Potassium 4.5; Sodium 138   Recent Lipid Panel No results found for: CHOL, TRIG, HDL, CHOLHDL, VLDL, LDLCALC, LDLDIRECT  Physical Exam:   VS:  BP 124/68   Pulse 67   Ht 6\' 1"  (1.854 m)   Wt 240 lb 6.4 oz (109 kg)   SpO2 97%   BMI 31.72 kg/m    Wt Readings from Last 3 Encounters:  10/22/19 240 lb 6.4 oz (109 kg)  10/02/19 241 lb 9.6 oz (109.6 kg)  09/04/19 233 lb (105.7 kg)    General: Well nourished, well developed, in no acute distress Heart: Atraumatic, normal size  Eyes: PEERLA, EOMI  Neck: Supple, no  JVD Endocrine: No thryomegaly Cardiac: Normal S1, S2; RRR; no murmurs, rubs, or gallops Lungs: Clear to auscultation bilaterally, no wheezing, rhonchi or rales  Abd: Soft, nontender, no hepatomegaly  Ext: No edema, pulses 2+ Musculoskeletal: No deformities, BUE and BLE strength normal and equal Skin: Warm and dry, no rashes   Neuro: Alert and oriented to person, place, time, and situation, CNII-XII grossly intact, no focal deficits  Psych: Normal mood and affect   ASSESSMENT:   Ronald Kirkman  Davis is a 69 y.o. male who presents for the following: 1. Persistent atrial fibrillation ([place])   2. Coronary artery disease involving native coronary artery of native heart without angina pectoris   3. Primary insomnia     PLAN:   1. Persistent atrial fibrillation ([place]) -Status post cardiac ablation.  This was in January 2021.  He will continue on Eliquis religiously.  No missed doses.  I have stopped his diltiazem given his lightheadedness upon standing.  I suspect this is probably worsening his symptoms.  We will see how he does without this.  2. Coronary artery disease involving native coronary artery of native heart without angina pectoris -Most recent calcium score over 900.  Most recent LDL per my review of [facility] shows an LDL of 131, total cholesterol 95, triglycerides 245, HDL 33 -We will continue Crestor 20 mg daily for now.  We will plan to recheck in a few months.  He reports he would like to try to improve his diet before changing medications at this time.  3. Primary insomnia -Refill trazodone 50 mg nightly   Disposition: Return in about 3 months (around 01/22/2020).  Medication Adjustments/Labs and Tests Ordered: Current medicines are reviewed at length with the patient today.  Concerns regarding medicines are outlined above.  Orders Placed This Encounter  Procedures  . Lipid panel   Meds ordered this encounter  Medications  . traZODone (DESYREL) 50 MG tablet     Sig: Take 1 tablet (50 mg total) by mouth at bedtime.    Dispense:  90 tablet    Refill:  3    Patient Instructions  Medication Instructions:  Stop Diltiazem  *If you need a refill on your cardiac medications before your next appointment, please call your pharmacy*   Lab Work: Lipid Panel Attached are the lab orders that are needed before your upcoming appointment, please come in one week before follow up, anytime to have your labs drawn.   They are fasting labs, so nothing to eat or drink after midnight.  Lab hours: 8:00-4:00 lunch hours 12:45-1:45  If you have labs (blood work) drawn today and your tests are completely normal, you will receive your results only by: Marland Kitchen MyChart Message (if you have MyChart) OR . A paper copy in the mail If you have any lab test that is abnormal or we need to change your treatment, we will call you to review the results.   Follow-Up: At [facility], you and your health needs are our priority.  As part of our continuing mission to provide you with exceptional heart care, we have created designated Provider Care Teams.  These Care Teams include your primary Cardiologist (physician) and Advanced Practice Providers (APPs -  Physician Assistants and Nurse Practitioners) who all work together to provide you with the care you need, when you need it.  We recommend signing up for the patient portal called "MyChart".  Sign up information is provided on this After Visit Summary.  MyChart is used to connect with patients for Virtual Visits (Telemedicine).  Patients are able to view lab/test results, encounter notes, upcoming appointments, etc.  Non-urgent messages can be sent to your provider as well.   To learn more about what you can do with MyChart, go to [email or web address].    Your next appointment:   3 month(s)  The format for your next appointment:   In Person  Provider:   Eleonore Chiquito, MD  Time Spent with Patient: I have spent a  total of 35 minutes with patient reviewing hospital notes, telemetry, EKGs, labs and examining the patient as well as establishing an assessment and plan that was discussed with the patient.  > 50% of time was spent in direct patient care.  Signed, Addison Naegeli. Audie Box, [place]  [address] [place], [place] [address] [phone]  10/22/2019 10:38 AM

## 2019-10-22 ENCOUNTER — Other Ambulatory Visit: Payer: Self-pay

## 2019-10-22 ENCOUNTER — Encounter: Payer: Self-pay | Admitting: Cardiovascular Disease

## 2019-10-22 ENCOUNTER — Ambulatory Visit (INDEPENDENT_AMBULATORY_CARE_PROVIDER_SITE_OTHER): Payer: Medicare Other | Admitting: Cardiovascular Disease

## 2019-10-22 VITALS — BP 124/68 | HR 67 | Ht 73.0 in | Wt 240.4 lb

## 2019-10-22 DIAGNOSIS — I2510 Atherosclerotic heart disease of native coronary artery without angina pectoris: Secondary | ICD-10-CM

## 2019-10-22 DIAGNOSIS — I4819 Other persistent atrial fibrillation: Secondary | ICD-10-CM | POA: Diagnosis not present

## 2019-10-22 DIAGNOSIS — F5101 Primary insomnia: Secondary | ICD-10-CM | POA: Diagnosis not present

## 2019-10-22 MED ORDER — TRAZODONE HCL 50 MG PO TABS: 50.0000 mg | ORAL_TABLET | Freq: Every day | ORAL | 3 refills | Status: DC

## 2019-10-22 NOTE — Patient Instructions (Addendum)
Medication Instructions:  Stop Diltiazem  *If you need a refill on your cardiac medications before your next appointment, please call your pharmacy*   Lab Work: Lipid Panel Attached are the lab orders that are needed before your upcoming appointment, please come in one week before follow up, anytime to have your labs drawn.   They are fasting labs, so nothing to eat or drink after midnight.  Lab hours: 8:00-4:00 lunch hours 12:45-1:45  If you have labs (blood work) drawn today and your tests are completely normal, you will receive your results only by: Marland Kitchen MyChart Message (if you have MyChart) OR . A paper copy in the mail If you have any lab test that is abnormal or we need to change your treatment, we will call you to review the results.   Follow-Up: At [facility], you and your health needs are our priority.  As part of our continuing mission to provide you with exceptional heart care, we have created designated Provider Care Teams.  These Care Teams include your primary Cardiologist (physician) and Advanced Practice Providers (APPs -  Physician Assistants and Nurse Practitioners) who all work together to provide you with the care you need, when you need it.  We recommend signing up for the patient portal called "MyChart".  Sign up information is provided on this After Visit Summary.  MyChart is used to connect with patients for Virtual Visits (Telemedicine).  Patients are able to view lab/test results, encounter notes, upcoming appointments, etc.  Non-urgent messages can be sent to your provider as well.   To learn more about what you can do with MyChart, go to [email or web address].    Your next appointment:   3 month(s)  The format for your next appointment:   In Person  Provider:   Eleonore Chiquito, MD

## 2019-12-05 ENCOUNTER — Telehealth: Payer: Medicare Other | Admitting: Internal Medicine

## 2019-12-10 ENCOUNTER — Encounter: Payer: Self-pay | Admitting: Internal Medicine

## 2019-12-10 ENCOUNTER — Other Ambulatory Visit: Payer: Self-pay

## 2019-12-10 ENCOUNTER — Telehealth (INDEPENDENT_AMBULATORY_CARE_PROVIDER_SITE_OTHER): Payer: Medicare Other | Admitting: Internal Medicine

## 2019-12-10 VITALS — Ht 73.0 in | Wt 235.0 lb

## 2019-12-10 DIAGNOSIS — D6869 Other thrombophilia: Secondary | ICD-10-CM | POA: Diagnosis not present

## 2019-12-10 DIAGNOSIS — G4733 Obstructive sleep apnea (adult) (pediatric): Secondary | ICD-10-CM

## 2019-12-10 DIAGNOSIS — I480 Paroxysmal atrial fibrillation: Secondary | ICD-10-CM | POA: Diagnosis not present

## 2019-12-10 DIAGNOSIS — R931 Abnormal findings on diagnostic imaging of heart and coronary circulation: Secondary | ICD-10-CM

## 2019-12-10 DIAGNOSIS — I2510 Atherosclerotic heart disease of native coronary artery without angina pectoris: Secondary | ICD-10-CM | POA: Diagnosis not present

## 2019-12-10 DIAGNOSIS — E782 Mixed hyperlipidemia: Secondary | ICD-10-CM | POA: Diagnosis not present

## 2019-12-10 NOTE — Patient Instructions (Signed)
Medication Instructions:  Your physician recommends that you continue on your current medications as directed. Please refer to the Current Medication list given to you today.  *If you need a refill on your cardiac medications before your next appointment, please call your pharmacy*   Lab Work: None ordered.  If you have labs (blood work) drawn today and your tests are completely normal, you will receive your results only by: Marland Kitchen MyChart Message (if you have MyChart) OR . A paper copy in the mail If you have any lab test that is abnormal or we need to change your treatment, we will call you to review the results.   Testing/Procedures: None ordered.    Follow-Up: At [facility], you and your health needs are our priority.  As part of our continuing mission to provide you with exceptional heart care, we have created designated Provider Care Teams.  These Care Teams include your primary Cardiologist (physician) and Advanced Practice Providers (APPs -  Physician Assistants and Nurse Practitioners) who all work together to provide you with the care you need, when you need it.  We recommend signing up for the patient portal called "MyChart".  Sign up information is provided on this After Visit Summary.  MyChart is used to connect with patients for Virtual Visits (Telemedicine).  Patients are able to view lab/test results, encounter notes, upcoming appointments, etc.  Non-urgent messages can be sent to your provider as well.   To learn more about what you can do with MyChart, go to [email or web address].    Your next appointment:   3 months, you will reeceive a letter in the maill to remind you to schedule.  Provider:   Dr Rayann Heman

## 2019-12-10 NOTE — Progress Notes (Signed)
Electrophysiology TeleHealth Note   Due to national recommendations of social distancing due to COVID 19, an audio/video telehealth visit is felt to be most appropriate for this patient at this time.  See MyChart message from today for the patient's consent to telehealth for [facility].  Date:  12/10/2019   ID:  Ronald Davis, DOB 1950/11/05, MRN [id number]  Location: patient's home  Provider location:  [place]  Evaluation Performed: Follow-up visit  PCP:  Aletha Halim., PA-C   Electrophysiologist:  Dr Rayann Heman  Chief Complaint:  palpitations  History of Present Illness:    Ronald Davis is a 69 y.o. male who presents via telehealth conferencing today.  Since his ablation, the patient reports doing very well.  He is pleased with results and denies procedure related complications.  Today, he denies symptoms of palpitations, chest pain, shortness of breath,  lower extremity edema, dizziness, presyncope, or syncope.  The patient is otherwise without complaint today.   Past Medical History:  Diagnosis Date  . Allergy   . Coronary artery disease   . Hyperlipidemia   . Low BP   . Paroxysmal atrial fibrillation (H[facility])   . Sleep apnea     Past Surgical History:  Procedure Laterality Date  . ATRIAL FIBRILLATION ABLATION N/A 09/04/2019   Procedure: ATRIAL FIBRILLATION ABLATION;  Surgeon: Thompson Grayer, MD;  Location: [place] [facility];  Service: Cardiovascular;  Laterality: N/A;  . CARDIOVERSION N/A 07/20/2019   Procedure: CARDIOVERSION;  Surgeon: Fay Records, MD;  Location: [facility] ENDOSCOPY;  Service: Cardiovascular;  Laterality: N/A;  . COLONOSCOPY    . ears     ears pinned back  . INGUINAL HERNIA REPAIR     right  . MOLE REMOVAL    . POLYPECTOMY    . RHINOPLASTY      for sleep apnea  . TEE WITHOUT CARDIOVERSION N/A 07/20/2019   Procedure: TRANSESOPHAGEAL ECHOCARDIOGRAM (TEE);  Surgeon: Fay Records, MD;  Location: [facility] ENDOSCOPY;  Service: Cardiovascular;   Laterality: N/A;  . tonisllectomy      Current Outpatient Medications  Medication Sig Dispense Refill  . clobetasol cream (TEMOVATE) [id number] % Apply 1 application topically at bedtime as needed (rash).    . rosuvastatin (CRESTOR) 20 MG tablet Take 1 tablet (20 mg total) by mouth at bedtime. 90 tablet 3  . tamsulosin (FLOMAX) 0.4 MG CAPS capsule Take 0.4 mg by mouth daily.     . traZODone (DESYREL) 50 MG tablet Take 1 tablet (50 mg total) by mouth at bedtime. 90 tablet 3  . apixaban (ELIQUIS) 5 MG TABS tablet Take 1 tablet (5 mg total) by mouth 2 (two) times daily. 180 tablet 3   No current facility-administered medications for this visit.    Allergies:   Naproxen, Amoxicillin, and Tomato   Social History:  The patient  reports that he has never smoked. He has never used smokeless tobacco. He reports current alcohol use. He reports that he does not use drugs.   ROS:  Please see the history of present illness.   All other systems are personally reviewed and negative.    Exam:    Vital Signs:  Ht 6\' 1"  (1.854 m)   Wt 235 lb (106.6 kg)   BMI 31.00 kg/m   Well sounding and appearing, alert and conversant, regular work of breathing,  good skin color Eyes- anicteric, neuro- grossly intact, skin- no apparent rash or lesions or cyanosis, mouth- oral mucosa is pink  Labs/Other Tests and Data Reviewed:    Recent Labs: 08/27/2019: Hemoglobin 13.4; Platelets 229 08/31/2019: BUN 17; Creatinine, Ser 1.62; Potassium 4.5; Sodium 138   Wt Readings from Last 3 Encounters:  12/10/19 235 lb (106.6 kg)  10/22/19 240 lb 6.4 oz (109 kg)  10/02/19 241 lb 9.6 oz (109.6 kg)     ASSESSMENT & PLAN:    1.  Paroxysmal atrial fibrillation Doing very well post ablation off AAD therapy chads2vasc score is 2.  He prefers to stay on eliquis as his father had a very bad stroke We discussed the importance of lifestyle modification at length today.  Arrest AF study results were discussed at length.  I have  advised regular exercise, weight loss, and ETOH avoidance.  2. OSA He has had corrective surgery and does not use CPAP  3. Obesity As above  4. CAD No ischemic symptoms No changes  5. HL Continue crestor  Follow-up:  3 months with me   Patient Risk:  after full review of this patients clinical status, I feel that they are at moderate risk at this time.  Today, I have spent 15 minutes with the patient with telehealth technology discussing arrhythmia management .    Army Fossa, MD  12/10/2019 2:58 PM     Ronald Davis [place] Ronald Davis [address] [phone] (office) [phone] (fax)

## 2020-01-16 ENCOUNTER — Ambulatory Visit: Payer: Medicare Other | Admitting: Cardiovascular Disease

## 2020-02-02 NOTE — Progress Notes (Signed)
Cardiology Office Note:   Date:  02/04/2020  NAME:  Ronald Davis    MRN: [id number] DOB:  [birth year]   PCP:  Aletha Halim., PA-C  Cardiologist:  Evalina Field, MD  Electrophysiologist:  None   Referring MD: Aletha Halim., PA-C   Chief Complaint  Patient presents with  . Atrial Fibrillation   History of Present Illness:   Ronald Davis is a 69 y.o. male with a hx of CAD, persistent Afib s/p ablation, HLD who presents for follow-up. HLD not at goal. CAC score 946.  He reports he has had no further recurrence of atrial fibrillation for his report.  He is doing yard work and doing housework without any limitations such as chest pain or shortness of breath.  He had no episodes of palpitations or rapid heartbeat sensation like he had before his ablation.  He reports that he has had 2 isolated episodes of dizziness with alcohol consumption.  This occurs when he consumes less than 1 alcoholic beverage.  He has refrain from alcohol use and had no further symptoms.  He reports he is just feeling better.  He also is improved with sleep while using trazodone.  He is on Crestor and we do need to recheck a profile on him in the next week or so.  He is having no issues bleeding and is on Eliquis.  He prefers to stay on this medication given his father's history of prior stroke.  I did discuss cardia mobile with him today and this may be a good option for him to monitor his atrial fibrillation to determine if there is recurrence.  His pulse is regular on exam today and I see no need for an EKG.  He seems to be doing quite well.  Problem List 1. Persistent Atrial Fibrillation  -TEE/DCCV 07/20/2019 -recurrence Afib 12/9 -ablation 09/04/2019 -on eliquis  2. CAD  -CAC 946 2021 -negative NM MPI 2015 3. HLD -T chol 195, TG 245, LDL 131, HDL 33  Past Medical History: Past Medical History:  Diagnosis Date  . Allergy   . Coronary artery disease   . Hyperlipidemia   . Low BP   . Paroxysmal  atrial fibrillation (HCC)   . Sleep apnea     Past Surgical History: Past Surgical History:  Procedure Laterality Date  . ATRIAL FIBRILLATION ABLATION N/A 09/04/2019   Procedure: ATRIAL FIBRILLATION ABLATION;  Surgeon: Thompson Grayer, MD;  Location: [facility] [place] [facility];  Service: Cardiovascular;  Laterality: N/A;  . CARDIOVERSION N/A 07/20/2019   Procedure: CARDIOVERSION;  Surgeon: Fay Records, MD;  Location: [facility] ENDOSCOPY;  Service: Cardiovascular;  Laterality: N/A;  . COLONOSCOPY    . ears     ears pinned back  . INGUINAL HERNIA REPAIR     right  . MOLE REMOVAL    . POLYPECTOMY    . RHINOPLASTY      for sleep apnea  . TEE WITHOUT CARDIOVERSION N/A 07/20/2019   Procedure: TRANSESOPHAGEAL ECHOCARDIOGRAM (TEE);  Surgeon: Fay Records, MD;  Location: [facility] ENDOSCOPY;  Service: Cardiovascular;  Laterality: N/A;  . tonisllectomy      Current Medications: Current Meds  Medication Sig  . rosuvastatin (CRESTOR) 20 MG tablet Take 1 tablet (20 mg total) by mouth at bedtime.  . tamsulosin (FLOMAX) 0.4 MG CAPS capsule Take 0.4 mg by mouth daily.   . traZODone (DESYREL) 50 MG tablet Take 1 tablet (50 mg total) by mouth at bedtime.  . [DISCONTINUED] rosuvastatin (CRESTOR)  20 MG tablet Take 1 tablet (20 mg total) by mouth at bedtime.  . [DISCONTINUED] traZODone (DESYREL) 50 MG tablet Take 1 tablet (50 mg total) by mouth at bedtime.     Allergies:    Naproxen, Amoxicillin, and Tomato   Social History: Social History   Socioeconomic History  . Marital status: Married    Spouse name: Not on file  . Number of children: 0  . Years of education: Not on file  . Highest education level: Not on file  Occupational History  . Not on file  Tobacco Use  . Smoking status: Never Smoker  . Smokeless tobacco: Never Used  Substance and Sexual Activity  . Alcohol use: Yes    Comment: Rarely-   . Drug use: No  . Sexual activity: Not on file  Other Topics Concern  . Not on file  Social History  Narrative   Forensic psychologist, retired    Investment banker, operational of Radio broadcast assistant Strain:   . Difficulty of Paying Living Expenses:   Food Insecurity:   . Worried About Charity fundraiser in the Last Year:   . Arboriculturist in the Last Year:   Transportation Needs:   . Film/video editor (Medical):   Marland Kitchen Lack of Transportation (Non-Medical):   Physical Activity:   . Days of Exercise per Week:   . Minutes of Exercise per Session:   Stress:   . Feeling of Stress :   Social Connections:   . Frequency of Communication with Friends and Family:   . Frequency of Social Gatherings with Friends and Family:   . Attends Religious Services:   . Active Member of Clubs or Organizations:   . Attends Archivist Meetings:   Marland Kitchen Marital Status:      Family History: The patient's family history includes CAD in his father; Colon cancer in his maternal grandmother; Colon polyps in his brother, brother, sister, sister, sister, and sister; Prostate cancer in his father; Stroke in his father and mother.  ROS:   All other ROS reviewed and negative. Pertinent positives noted in the HPI.     EKGs/Labs/Other Studies Reviewed:   The following studies were personally reviewed by me today:  TTE 07/24/2019 1. Left ventricular ejection fraction, by visual estimation, is 60 to  65%. The left ventricle has normal function. There is no left ventricular  hypertrophy.  2. Left ventricular diastolic function could not be evaluated.  3. Global right ventricle has normal systolic function.The right  ventricular size is normal. No increase in right ventricular wall  thickness.  4. Left atrial size was normal.  5. Right atrial size was normal.  6. The mitral valve is normal in structure. Mild mitral valve  regurgitation. No evidence of mitral stenosis.  7. The tricuspid valve is normal in structure. Tricuspid valve  regurgitation is mild.  8. The aortic valve is normal in  structure. Aortic valve regurgitation is  not visualized. Mild to moderate aortic valve sclerosis/calcification  without any evidence of aortic stenosis.  9. The pulmonic valve was normal in structure. Pulmonic valve  regurgitation is not visualized.  10. Normal pulmonary artery systolic pressure.  11. The inferior vena cava is normal in size with greater than 50%  respiratory variability, suggesting right atrial pressure of 3 mmHg.   CT PV study IMPRESSION: 1. There is normal pulmonary vein drainage into the left atrium.  2. There is no thrombus in the left atrial appendage.  3. The esophagus runs in the left atrial midline and is not in the proximity to any of the pulmonary veins.  4. No PFO/ASD.  5. Normal coronary origin. Right dominance.  6. CAC score of 946 which is 84th percentile for age- and sex-matched controls. The study was performed without use of NTG and is insufficient for plaque evaluation.  7. Normal LV function, EF 68%.    Recent Labs: 08/27/2019: Hemoglobin 13.4; Platelets 229 08/31/2019: BUN 17; Creatinine, Ser 1.62; Potassium 4.5; Sodium 138   Recent Lipid Panel No results found for: CHOL, TRIG, HDL, CHOLHDL, VLDL, LDLCALC, LDLDIRECT  Physical Exam:   VS:  BP 118/82   Pulse 69   Temp 98.2 F (36.8 C)   Ht 6' (1.829 m)   Wt 232 lb 3.2 oz (105.3 kg)   SpO2 98%   BMI 31.49 kg/m    Wt Readings from Last 3 Encounters:  02/04/20 232 lb 3.2 oz (105.3 kg)  12/10/19 235 lb (106.6 kg)  10/22/19 240 lb 6.4 oz (109 kg)    General: Well nourished, well developed, in no acute distress Heart: Atraumatic, normal size  Eyes: PEERLA, EOMI  Neck: Supple, no JVD Endocrine: No thryomegaly Cardiac: Normal S1, S2; RRR; no murmurs, rubs, or gallops Lungs: Clear to auscultation bilaterally, no wheezing, rhonchi or rales  Abd: Soft, nontender, no hepatomegaly  Ext: No edema, pulses 2+ Musculoskeletal: No deformities, BUE and BLE strength normal and  equal Skin: Warm and dry, no rashes   Neuro: Alert and oriented to person, place, time, and situation, CNII-XII grossly intact, no focal deficits  Psych: Normal mood and affect   ASSESSMENT:   EIDAN MUELLNER is a 69 y.o. male who presents for the following: 1. Coronary artery disease involving native coronary artery of native heart without angina pectoris   2. Mixed hyperlipidemia   3. Persistent atrial fibrillation ([place])   4. Primary insomnia   5. Atrial fibrillation, unspecified type ([place])   6. Elevated coronary artery calcium score     PLAN:   1. Coronary artery disease involving native coronary artery of native heart without angina pectoris -Calcium score 946.  He is on Crestor 20 mg daily.  We will recheck a lipid profile in the next 1 to 2 weeks.  Goal LDL cholesterol is less than 70.  He has no symptoms of angina or chest pain.  He had a normal nuclear medicine stress test in 2015.  We will continue with risk factor factor modification.  2. Mixed hyperlipidemia -Recheck lipid profile next week.  Continue Crestor 20 mg daily.  3. Persistent atrial fibrillation (HCC) -Status post A. fib ablation.  No further recurrence.  Continue Eliquis 5 mg twice daily.  He will look into the cardia mobile device.  4. Primary insomnia -Continue trazodone 50 mg at bedtime.  Disposition: Return in about 6 months (around 08/05/2020).  Medication Adjustments/Labs and Tests Ordered: Current medicines are reviewed at length with the patient today.  Concerns regarding medicines are outlined above.  Orders Placed This Encounter  Procedures  . Lipid panel  . Basic metabolic panel   Meds ordered this encounter  Medications  . apixaban (ELIQUIS) 5 MG TABS tablet    Sig: Take 1 tablet (5 mg total) by mouth 2 (two) times daily.    Dispense:  180 tablet    Refill:  3  . traZODone (DESYREL) 50 MG tablet    Sig: Take 1 tablet (50 mg total) by mouth at bedtime.  Dispense:  90 tablet    Refill:  3   . rosuvastatin (CRESTOR) 20 MG tablet    Sig: Take 1 tablet (20 mg total) by mouth at bedtime.    Dispense:  90 tablet    Refill:  3    Patient Instructions  Medication Instructions:  The current medical regimen is effective;  continue present plan and medications.  *If you need a refill on your cardiac medications before your next appointment, please call your pharmacy*   Lab Work: LIPID, BMET next week- (no appointment needed, come fasting)   If you have labs (blood work) drawn today and your tests are completely normal, you will receive your results only by: Marland Kitchen MyChart Message (if you have MyChart) OR . A paper copy in the mail If you have any lab test that is abnormal or we need to change your treatment, we will call you to review the results.   Follow-Up: At [facility], you and your health needs are our priority.  As part of our continuing mission to provide you with exceptional heart care, we have created designated Provider Care Teams.  These Care Teams include your primary Cardiologist (physician) and Advanced Practice Providers (APPs -  Physician Assistants and Nurse Practitioners) who all work together to provide you with the care you need, when you need it.  We recommend signing up for the patient portal called "MyChart".  Sign up information is provided on this After Visit Summary.  MyChart is used to connect with patients for Virtual Visits (Telemedicine).  Patients are able to view lab/test results, encounter notes, upcoming appointments, etc.  Non-urgent messages can be sent to your provider as well.   To learn more about what you can do with MyChart, go to [email or web address].    Your next appointment:   6 month(s)  The format for your next appointment:   In Person  Provider:   Eleonore Chiquito, MD        Time Spent with Patient: I have spent a total of 35 minutes with patient reviewing hospital notes, telemetry, EKGs, labs and examining the patient as  well as establishing an assessment and plan that was discussed with the patient.  > 50% of time was spent in direct patient care.  Signed, Addison Naegeli. Audie Box, [place]  [address] [place], [place] [address] [phone]  02/04/2020 12:21 PM

## 2020-02-04 ENCOUNTER — Encounter: Payer: Self-pay | Admitting: Cardiovascular Disease

## 2020-02-04 ENCOUNTER — Other Ambulatory Visit: Payer: Self-pay

## 2020-02-04 ENCOUNTER — Ambulatory Visit (INDEPENDENT_AMBULATORY_CARE_PROVIDER_SITE_OTHER): Payer: Medicare Other | Admitting: Cardiovascular Disease

## 2020-02-04 VITALS — BP 118/82 | HR 69 | Temp 98.2°F | Ht 72.0 in | Wt 232.2 lb

## 2020-02-04 DIAGNOSIS — E782 Mixed hyperlipidemia: Secondary | ICD-10-CM | POA: Diagnosis not present

## 2020-02-04 DIAGNOSIS — F5101 Primary insomnia: Secondary | ICD-10-CM | POA: Diagnosis not present

## 2020-02-04 DIAGNOSIS — I2510 Atherosclerotic heart disease of native coronary artery without angina pectoris: Secondary | ICD-10-CM

## 2020-02-04 DIAGNOSIS — I4891 Unspecified atrial fibrillation: Secondary | ICD-10-CM

## 2020-02-04 DIAGNOSIS — I4819 Other persistent atrial fibrillation: Secondary | ICD-10-CM

## 2020-02-04 DIAGNOSIS — R931 Abnormal findings on diagnostic imaging of heart and coronary circulation: Secondary | ICD-10-CM

## 2020-02-04 MED ORDER — ROSUVASTATIN CALCIUM 20 MG PO TABS: 20.0000 mg | ORAL_TABLET | Freq: Every day | ORAL | 3 refills | Status: DC

## 2020-02-04 MED ORDER — APIXABAN 5 MG PO TABS: 5.0000 mg | ORAL_TABLET | Freq: Two times a day (BID) | ORAL | 3 refills | Status: DC

## 2020-02-04 MED ORDER — TRAZODONE HCL 50 MG PO TABS: 50.0000 mg | ORAL_TABLET | Freq: Every day | ORAL | 3 refills | Status: DC

## 2020-02-04 NOTE — Patient Instructions (Signed)
Medication Instructions:  The current medical regimen is effective;  continue present plan and medications.  *If you need a refill on your cardiac medications before your next appointment, please call your pharmacy*   Lab Work: LIPID, BMET next week- (no appointment needed, come fasting)   If you have labs (blood work) drawn today and your tests are completely normal, you will receive your results only by: Marland Kitchen MyChart Message (if you have MyChart) OR . A paper copy in the mail If you have any lab test that is abnormal or we need to change your treatment, we will call you to review the results.   Follow-Up: At [facility], you and your health needs are our priority.  As part of our continuing mission to provide you with exceptional heart care, we have created designated Provider Care Teams.  These Care Teams include your primary Cardiologist (physician) and Advanced Practice Providers (APPs -  Physician Assistants and Nurse Practitioners) who all work together to provide you with the care you need, when you need it.  We recommend signing up for the patient portal called "MyChart".  Sign up information is provided on this After Visit Summary.  MyChart is used to connect with patients for Virtual Visits (Telemedicine).  Patients are able to view lab/test results, encounter notes, upcoming appointments, etc.  Non-urgent messages can be sent to your provider as well.   To learn more about what you can do with MyChart, go to [email or web address].    Your next appointment:   6 month(s)  The format for your next appointment:   In Person  Provider:   Eleonore Chiquito, MD

## 2020-02-12 ENCOUNTER — Ambulatory Visit (INDEPENDENT_AMBULATORY_CARE_PROVIDER_SITE_OTHER): Payer: Medicare Other | Admitting: Cardiovascular Disease

## 2020-02-12 ENCOUNTER — Encounter: Payer: Self-pay | Admitting: Cardiovascular Disease

## 2020-02-12 ENCOUNTER — Telehealth: Payer: Self-pay | Admitting: Cardiovascular Disease

## 2020-02-12 ENCOUNTER — Other Ambulatory Visit: Payer: Self-pay

## 2020-02-12 VITALS — BP 114/74 | HR 55 | Resp 18 | Ht 73.0 in | Wt 232.0 lb

## 2020-02-12 DIAGNOSIS — I4891 Unspecified atrial fibrillation: Secondary | ICD-10-CM

## 2020-02-12 DIAGNOSIS — R931 Abnormal findings on diagnostic imaging of heart and coronary circulation: Secondary | ICD-10-CM | POA: Diagnosis not present

## 2020-02-12 DIAGNOSIS — E782 Mixed hyperlipidemia: Secondary | ICD-10-CM

## 2020-02-12 DIAGNOSIS — R42 Dizziness and giddiness: Secondary | ICD-10-CM | POA: Diagnosis not present

## 2020-02-12 NOTE — Telephone Encounter (Signed)
Attempted to contact pt again to schedule an appointment today. Left message to call back.

## 2020-02-12 NOTE — Telephone Encounter (Signed)
Spoke to pt who report since yesterday he's been feeling like his BP is low but did not check it. He state he very tired and both arms feel very heavy. This morning he report BP 119/75 HR 66 but still very tired. He denies CP, SOB, but report he does feel like he's in afib.  Dr. Marisue Ivan made aware and recommended pt schedule an appointment  today for further evaluation. Nurse attempted to contact pt and left a message to call back to schedule.

## 2020-02-12 NOTE — Telephone Encounter (Signed)
Left message to call back  

## 2020-02-12 NOTE — Patient Instructions (Signed)
Medication Instructions:  The current medical regimen is effective;  continue present plan and medications.  *If you need a refill on your cardiac medications before your next appointment, please call your pharmacy*   Follow-Up: At [facility], you and your health needs are our priority.  As part of our continuing mission to provide you with exceptional heart care, we have created designated Provider Care Teams.  These Care Teams include your primary Cardiologist (physician) and Advanced Practice Providers (APPs -  Physician Assistants and Nurse Practitioners) who all work together to provide you with the care you need, when you need it.  We recommend signing up for the patient portal called "MyChart".  Sign up information is provided on this After Visit Summary.  MyChart is used to connect with patients for Virtual Visits (Telemedicine).  Patients are able to view lab/test results, encounter notes, upcoming appointments, etc.  Non-urgent messages can be sent to your provider as well.   To learn more about what you can do with MyChart, go to [email or web address].    Your next appointment:   1 month(s)  The format for your next appointment:   In Person  Provider:   Eleonore Chiquito, MD   Other Instructions Drink 8-10 glasses of water a day.  Message [place] at the end of the week on how you are feeling.

## 2020-02-12 NOTE — Telephone Encounter (Signed)
New message   Patient is adding on to mychart message : he states that he is experiencing lightheadedness.  Please call.

## 2020-02-12 NOTE — Progress Notes (Signed)
Cardiology Office Note:   Date:  02/12/2020  NAME:  Ronald Davis    MRN: [id number] DOB:  Nov 28, 1950   PCP:  Aletha Halim., PA-C  Cardiologist:  Evalina Field, MD  Electrophysiologist:  None   Referring MD: Aletha Halim., PA-C   Chief Complaint  Patient presents with   Near Syncope   History of Present Illness:   Ronald Davis is a 69 y.o. male with a hx of persistent atrial fibrillation status post cardiac ablation, CAD (elevated calcium score), HLD who presents for acute evaluation of dizziness.  He reports yesterday he was working outside about 20 minutes in the hot sun.  He reports he walked inside and sat down.  He describes a sensation of inability to move his arms that was associated with profound weakness and fatigue.  Apparently his symptoms lasted most of the day.  He reports he did not drink enough fluids yesterday.  Apparently his symptoms lasted into today and he is just felt poor and fatigue.  He did feel his heart racing a bit and used his cardia mobile device.  It appears to show PACs on my review.  He was then worked in for an urgent evaluation.  His EKG today shows no recurrence of atrial fibrillation but PACs.  On further questioning he reports he is drinking 2 to 3 glasses of water per day.  He does drink ginger ale and coffee but not enough water.  He had no overt syncope but just felt wary.  He reports that he has had low blood pressure all his life.  He is taken salt tablets with some improvement in symptoms.  He apparently took a salt tablet yesterday with no improvement.  He denies any chest pain or shortness of breath.  He is quite concerned that his atrial fibrillation had recurred.  We did check orthostatic vital signs which show a laying blood pressure of 114/76 and a heart rate of 55.  Upon standing for 3 minutes his blood pressure did decrease to 102/66 with a heart rate of 72.  It appears he is likely orthostatic.  He describes no dizziness currently.   He has not hydrated well today.   Problem List 1. Persistent Atrial Fibrillation  -TEE/DCCV 07/20/2019 -recurrence Afib 12/9 -ablation 09/04/2019 -on eliquis  2. CAD  -[id number] 2021 -negative NM MPI2015 3. HLD -T chol 195, TG 245, LDL 131, HDL 33  Past Medical History: Past Medical History:  Diagnosis Date   Allergy    Coronary artery disease    Hyperlipidemia    Low BP    Paroxysmal atrial fibrillation (HCC)    Sleep apnea     Past Surgical History: Past Surgical History:  Procedure Laterality Date   ATRIAL FIBRILLATION ABLATION N/A 09/04/2019   Procedure: ATRIAL FIBRILLATION ABLATION;  Surgeon: Thompson Grayer, MD;  Location: [place] [facility];  Service: Cardiovascular;  Laterality: N/A;   CARDIOVERSION N/A 07/20/2019   Procedure: CARDIOVERSION;  Surgeon: Fay Records, MD;  Location: [place];  Service: Cardiovascular;  Laterality: N/A;   COLONOSCOPY     ears     ears pinned back   INGUINAL HERNIA REPAIR     right   MOLE REMOVAL     POLYPECTOMY     RHINOPLASTY      for sleep apnea   TEE WITHOUT CARDIOVERSION N/A 07/20/2019   Procedure: TRANSESOPHAGEAL ECHOCARDIOGRAM (TEE);  Surgeon: Fay Records, MD;  Location: [place];  Service: Cardiovascular;  Laterality: N/A;   tonisllectomy      Current Medications: Current Meds  Medication Sig   apixaban (ELIQUIS) 5 MG TABS tablet Take 1 tablet (5 mg total) by mouth 2 (two) times daily.   rosuvastatin (CRESTOR) 20 MG tablet Take 1 tablet (20 mg total) by mouth at bedtime.   tamsulosin (FLOMAX) 0.4 MG CAPS capsule Take 0.4 mg by mouth daily.    traZODone (DESYREL) 50 MG tablet Take 1 tablet (50 mg total) by mouth at bedtime.     Allergies:    Naproxen, Amoxicillin, and Tomato   Social History: Social History   Socioeconomic History   Marital status: Married    Spouse name: Not on file   Number of children: 0   Years of education: Not on file   Highest education level: Not on file    Occupational History   Not on file  Tobacco Use   Smoking status: Never Smoker   Smokeless tobacco: Never Used  Substance and Sexual Activity   Alcohol use: Yes    Comment: Rarely-    Drug use: No   Sexual activity: Not on file  Other Topics Concern   Not on file  Social History Narrative   Forensic psychologist, retired    Investment banker, operational of Radio broadcast assistant Strain:    Difficulty of Paying Living Expenses:   Food Insecurity:    Worried About Charity fundraiser in the Last Year:    Arboriculturist in the Last Year:   Transportation Needs:    Film/video editor (Medical):    Lack of Transportation (Non-Medical):   Physical Activity:    Days of Exercise per Week:    Minutes of Exercise per Session:   Stress:    Feeling of Stress :   Social Connections:    Frequency of Communication with Friends and Family:    Frequency of Social Gatherings with Friends and Family:    Attends Religious Services:    Active Member of Clubs or Organizations:    Attends Music therapist:    Marital Status:      Family History: The patient's family history includes CAD in his father; Colon cancer in his maternal grandmother; Colon polyps in his brother, brother, sister, sister, sister, and sister; Prostate cancer in his father; Stroke in his father and mother.  ROS:   All other ROS reviewed and negative. Pertinent positives noted in the HPI.     EKGs/Labs/Other Studies Reviewed:   The following studies were personally reviewed by me today:  EKG:  EKG is ordered today.  The ekg ordered today demonstrates sinus bradycardia, heart rate 58, PACs noted, and was personally reviewed by me.   Recent Labs: 08/27/2019: Hemoglobin 13.4; Platelets 229 08/31/2019: BUN 17; Creatinine, Ser 1.62; Potassium 4.5; Sodium 138   Recent Lipid Panel No results found for: CHOL, TRIG, HDL, CHOLHDL, VLDL, LDLCALC, LDLDIRECT  Physical Exam:   VS:  Ht 6\' 1"   (1.854 m)    Wt 232 lb (105.2 kg)    BMI 30.61 kg/m    Wt Readings from Last 3 Encounters:  02/12/20 232 lb (105.2 kg)  02/04/20 232 lb 3.2 oz (105.3 kg)  12/10/19 235 lb (106.6 kg)    General: Well nourished, well developed, in no acute distress Heart: Atraumatic, normal size  Eyes: PEERLA, EOMI  Neck: Supple, no JVD Endocrine: No thryomegaly Cardiac: Normal S1, S2; RRR; no murmurs, rubs, or gallops Lungs: Clear to auscultation  bilaterally, no wheezing, rhonchi or rales  Abd: Soft, nontender, no hepatomegaly  Ext: No edema, pulses 2+ Musculoskeletal: No deformities, BUE and BLE strength normal and equal Skin: Warm and dry, no rashes   Neuro: Alert and oriented to person, place, time, and situation, CNII-XII grossly intact, no focal deficits  Psych: Normal mood and affect   ASSESSMENT:   APOLINAR BERO is a 69 y.o. male who presents for the following: 1. Dizziness   2. Atrial fibrillation, unspecified type ([place])   3. Mixed hyperlipidemia   4. Elevated coronary artery calcium score     PLAN:   1. Dizziness -Blood pressure laying down 114/74 with heart rate 55.  Upon standing his blood pressure did decrease to 102/66 with heart rate 72.  He does screen positive for orthostatics.  His EKG does not demonstrate recurrence of atrial fibrillation.  I have encouraged him to triple the amount of water he is drinking.  He should drink 8 to 10 glasses of water per day.  I would like him to cut back on the caffeine as well as caffeinated drinks such as ginger ale.  It would also be okay for him to drink Gatorade or items with salt in it to raise his blood pressure.  He will let us know how he does by the end of the week.  It is important to keep in mind that he does have sinus bradycardia but no evidence of high-grade conduction disease.  His atrial fibrillation is predispose him to sick sinus syndrome but I see no evidence of this.  2. Atrial fibrillation, unspecified type ([place]) -TEE/DCCV  07/20/2019 -recurrence Afib 12/9 -ablation 09/04/2019 -on eliquis  -No recurrence of A. fib on her monitor  3. Mixed hyperlipidemia -We have plans to recheck his cholesterol profile in the next few months.  I would like his LDL cholesterol is 170.  4. Elevated coronary artery calcium score -Goal LDL less than 70.  Disposition: Return in about 1 month (around 03/13/2020).  Medication Adjustments/Labs and Tests Ordered: Current medicines are reviewed at length with the patient today.  Concerns regarding medicines are outlined above.  Orders Placed This Encounter  Procedures   EKG 12-Lead   No orders of the defined types were placed in this encounter.   Patient Instructions  Medication Instructions:  The current medical regimen is effective;  continue present plan and medications.  *If you need a refill on your cardiac medications before your next appointment, please call your pharmacy*   Follow-Up: At [facility], you and your health needs are our priority.  As part of our continuing mission to provide you with exceptional heart care, we have created designated Provider Care Teams.  These Care Teams include your primary Cardiologist (physician) and Advanced Practice Providers (APPs -  Physician Assistants and Nurse Practitioners) who all work together to provide you with the care you need, when you need it.  We recommend signing up for the patient portal called "MyChart".  Sign up information is provided on this After Visit Summary.  MyChart is used to connect with patients for Virtual Visits (Telemedicine).  Patients are able to view lab/test results, encounter notes, upcoming appointments, etc.  Non-urgent messages can be sent to your provider as well.   To learn more about what you can do with MyChart, go to [email or web address].    Your next appointment:   1 month(s)  The format for your next appointment:   In Person  Provider:   Eleonore Chiquito,  MD   Other  Instructions Drink 8-10 glasses of water a day.  Message [place] at the end of the week on how you are feeling.      Time Spent with Patient: I have spent a total of 25 minutes with patient reviewing hospital notes, telemetry, EKGs, labs and examining the patient as well as establishing an assessment and plan that was discussed with the patient.  > 50% of time was spent in direct patient care.  Signed, Addison Naegeli. Audie Box, [place]  [address] [place], [place] [address] [phone]  02/12/2020 3:51 PM

## 2020-02-12 NOTE — Telephone Encounter (Signed)
Attempted to contact wife and pt again to schedule appointment. Left a message to call office.

## 2020-02-12 NOTE — Telephone Encounter (Signed)
Patient called back returning a call from our office °

## 2020-02-15 NOTE — Telephone Encounter (Signed)
Patient was seen in office by Dr.O'Neal

## 2020-02-27 ENCOUNTER — Telehealth: Payer: Self-pay | Admitting: Cardiovascular Disease

## 2020-02-27 NOTE — Telephone Encounter (Signed)
Have him send the strips. -W

## 2020-02-27 NOTE — Telephone Encounter (Signed)
Spoke with the pt and he was sitting in his chair when he started to get light headed and he tried to get up and felt dizzy so he sat back down. He denies chest pain, palpitations, SOB..he says that he has been drinking plenty of fluids not much caffeine. No new OTC meds.   He checked his BP and HR for me and it showed 129/78 HR 94... he says he improving some while talking with me.   He says his Cardia App sowed HR 120 and possible Afib.. he has been taking his Eliquis.   He will try to download the strip and send to Dr. Audie Box.  He will continue to monitor his symptoms and if anything worsens he will call EMS.Marland Kitchen His wife is with him at his home.   I will forward to Dr. Audie Box for his review and advice.   Pt will call if anything changes.

## 2020-02-28 NOTE — Telephone Encounter (Signed)
Left message for patient to send strips

## 2020-02-29 NOTE — Telephone Encounter (Signed)
Patient will just let us know if this happens again-  He has been messaging via mychart with Dr.O'Neal and currently he is feeling better.   Patient was unable to send strips- but he will call us if the issues happen again and we can get him in to see Dr.O'Neal next week if needed.

## 2020-03-11 NOTE — Progress Notes (Signed)
Cardiology Office Note:   Date:  03/13/2020  NAME:  MALAHKI GASAWAY    MRN: [id number] DOB:  [birth year]   PCP:  Aletha Halim., PA-C  Cardiologist:  Evalina Field, MD  Electrophysiologist:  None   Referring MD: Aletha Halim., PA-C   Chief Complaint  Patient presents with  . Follow-up   History of Present Illness:   RONDY KRUPINSKI is a 69 y.o. male with a hx of persistent afib s/p ablation, CAD, HLD who presents for follow-up of Afib. He was seen last month for dizziness and found to be orthostatic.  Daughter office on 02/27/2020 with complaints of dizziness upon standing.  He reports he checked his cardia mobile device and it read possible A. fib.  He was unable to get [place] the strips.  He does present today and shows the strips.  They do show an irregular beat.  His EKG today shows normal sinus rhythm with heart rate 57 and atrial bigeminy.  He reports he has been feeling better since that time.  He is maintain adequate hydration and not had any further symptoms of dizziness or lightheadedness.  Has not had any shortness of breath reported.  No chest pain either.  He reports he just feels back to normal.  He has not worked in the hot sun since his last visit but he has plans to increase his activity level.  He was supposed to get a lipid panel done before he saw [place] today.  He just has not had time to do this.  Problem List 1. Persistent Atrial Fibrillation  -TEE/DCCV 07/20/2019 -recurrence Afib 12/9 -ablation 09/04/2019 -on eliquis  2. CAD  -[id number] 2021 -negative NM MPI2015 3. HLD -T chol 195, TG 245, LDL 131, HDL 33  Past Medical History: Past Medical History:  Diagnosis Date  . Allergy   . Coronary artery disease   . Hyperlipidemia   . Low BP   . Paroxysmal atrial fibrillation (HCC)   . Sleep apnea     Past Surgical History: Past Surgical History:  Procedure Laterality Date  . ATRIAL FIBRILLATION ABLATION N/A 09/04/2019   Procedure: ATRIAL FIBRILLATION ABLATION;   Surgeon: Thompson Grayer, MD;  Location: [place] [facility];  Service: Cardiovascular;  Laterality: N/A;  . CARDIOVERSION N/A 07/20/2019   Procedure: CARDIOVERSION;  Surgeon: Fay Records, MD;  Location: [facility];  Service: Cardiovascular;  Laterality: N/A;  . COLONOSCOPY    . ears     ears pinned back  . INGUINAL HERNIA REPAIR     right  . MOLE REMOVAL    . POLYPECTOMY    . RHINOPLASTY      for sleep apnea  . TEE WITHOUT CARDIOVERSION N/A 07/20/2019   Procedure: TRANSESOPHAGEAL ECHOCARDIOGRAM (TEE);  Surgeon: Fay Records, MD;  Location: [facility];  Service: Cardiovascular;  Laterality: N/A;  . tonisllectomy      Current Medications: Current Meds  Medication Sig  . apixaban (ELIQUIS) 5 MG TABS tablet Take 1 tablet (5 mg total) by mouth 2 (two) times daily.  Marland Kitchen doxycycline (VIBRAMYCIN) 100 MG capsule Take by mouth.  . rosuvastatin (CRESTOR) 20 MG tablet Take 1 tablet (20 mg total) by mouth at bedtime.  . tamsulosin (FLOMAX) 0.4 MG CAPS capsule Take 0.4 mg by mouth daily.   . traZODone (DESYREL) 50 MG tablet Take 1 tablet (50 mg total) by mouth at bedtime.     Allergies:    Naproxen, Amoxicillin, and Tomato   Social History:  Social History   Socioeconomic History  . Marital status: Married    Spouse name: Not on file  . Number of children: 0  . Years of education: Not on file  . Highest education level: Not on file  Occupational History  . Not on file  Tobacco Use  . Smoking status: Never Smoker  . Smokeless tobacco: Never Used  Substance and Sexual Activity  . Alcohol use: Yes    Comment: Rarely-   . Drug use: No  . Sexual activity: Not on file  Other Topics Concern  . Not on file  Social History Narrative   Forensic psychologist, retired    Investment banker, operational of Radio broadcast assistant Strain:   . Difficulty of Paying Living Expenses:   Food Insecurity:   . Worried About Charity fundraiser in the Last Year:   . Arboriculturist in the Last Year:    Transportation Needs:   . Film/video editor (Medical):   Marland Kitchen Lack of Transportation (Non-Medical):   Physical Activity:   . Days of Exercise per Week:   . Minutes of Exercise per Session:   Stress:   . Feeling of Stress :   Social Connections:   . Frequency of Communication with Friends and Family:   . Frequency of Social Gatherings with Friends and Family:   . Attends Religious Services:   . Active Member of Clubs or Organizations:   . Attends Archivist Meetings:   Marland Kitchen Marital Status:      Family History: The patient's family history includes CAD in his father; Colon cancer in his maternal grandmother; Colon polyps in his brother, brother, sister, sister, sister, and sister; Prostate cancer in his father; Stroke in his father and mother.  ROS:   All other ROS reviewed and negative. Pertinent positives noted in the HPI.     EKGs/Labs/Other Studies Reviewed:   The following studies were personally reviewed by me today:  EKG:  EKG is ordered today.  The ekg ordered today demonstrates normal sinus rhythm heart rate 57 with atrial bigeminy, and was personally reviewed by me.   TTE 07/24/2019 1. Left ventricular ejection fraction, by visual estimation, is 60 to  65%. The left ventricle has normal function. There is no left ventricular  hypertrophy.  2. Left ventricular diastolic function could not be evaluated.  3. Global right ventricle has normal systolic function.The right  ventricular size is normal. No increase in right ventricular wall  thickness.  4. Left atrial size was normal.  5. Right atrial size was normal.  6. The mitral valve is normal in structure. Mild mitral valve  regurgitation. No evidence of mitral stenosis.  7. The tricuspid valve is normal in structure. Tricuspid valve  regurgitation is mild.  8. The aortic valve is normal in structure. Aortic valve regurgitation is  not visualized. Mild to moderate aortic valve sclerosis/calcification   without any evidence of aortic stenosis.  9. The pulmonic valve was normal in structure. Pulmonic valve  regurgitation is not visualized.  10. Normal pulmonary artery systolic pressure.  11. The inferior vena cava is normal in size with greater than 50%  respiratory variability, suggesting right atrial pressure of 3 mmHg.   CT PV study IMPRESSION: 1. There is normal pulmonary vein drainage into the left atrium.  2. There is no thrombus in the left atrial appendage.  3. The esophagus runs in the left atrial midline and is not in the proximity to any  of the pulmonary veins.  4. No PFO/ASD.  5. Normal coronary origin. Right dominance.  6. CAC score of 946 which is 84th percentile for age- and sex-matched controls. The study was performed without use of NTG and is insufficient for plaque evaluation.  7. Normal LV function, EF 68%.  Recent Labs: 08/27/2019: Hemoglobin 13.4; Platelets 229 08/31/2019: BUN 17; Creatinine, Ser 1.62; Potassium 4.5; Sodium 138   Recent Lipid Panel No results found for: CHOL, TRIG, HDL, CHOLHDL, VLDL, LDLCALC, LDLDIRECT  Physical Exam:   VS:  BP 118/72   Pulse 60   Ht 6\' 1"  (1.854 m)   Wt (!) 227 lb (103 kg)   SpO2 96%   BMI 29.95 kg/m    Wt Readings from Last 3 Encounters:  03/13/20 (!) 227 lb (103 kg)  02/12/20 232 lb (105.2 kg)  02/04/20 232 lb 3.2 oz (105.3 kg)    General: Well nourished, well developed, in no acute distress Heart: Atraumatic, normal size  Eyes: PEERLA, EOMI  Neck: Supple, no JVD Endocrine: No thryomegaly Cardiac: Normal S1, S2; irregular rhythm, no murmurs rubs or gallops Lungs: Clear to auscultation bilaterally, no wheezing, rhonchi or rales  Abd: Soft, nontender, no hepatomegaly  Ext: No edema, pulses 2+ Musculoskeletal: No deformities, BUE and BLE strength normal and equal Skin: Warm and dry, no rashes   Neuro: Alert and oriented to person, place, time, and situation, CNII-XII grossly intact, no focal  deficits  Psych: Normal mood and affect   ASSESSMENT:   LOGYN KENDRICK is a 69 y.o. male who presents for the following: 1. Persistent atrial fibrillation ([place])   2. Coronary artery disease involving native coronary artery of native heart without angina pectoris   3. Essential hypertension   4. Mixed hyperlipidemia   5. Atrial bigeminy   6. PAC (premature atrial contraction)     PLAN:   1. Persistent atrial fibrillation (HCC) -Status post A. fib ablation.  No recurrence of atrial fibrillation.  And atrial bigeminy.  No symptoms from this.  We will continue with Eliquis and hold on metoprolol for now.  He is not interested in taking medications unless needed.  2. Coronary artery disease involving native coronary artery of native heart without angina pectoris -Elevated coronary calcium score.  Continue Crestor 20 mg daily.  He does need a repeat lipid profile at some point.  He was unable to do it but hopefully get this done over the next few weeks.  3. Essential hypertension -Well-controlled on no medication.  4. Mixed hyperlipidemia -Continue statin.  Repeat lipid profile.  5. Atrial bigeminy 6. PAC (premature atrial contraction) -He is having frequent PACs and atrial bigeminy.  He is having no further symptoms from this.  He appears to be doing well.  We will hold on medication at this time.  He will let us know if he feels poorly with higher levels of activity.  Apparently he is asymptomatic today.  This is not specifically require treatment we will see how he does.   Disposition: Return in about 3 months (around 06/13/2020).  Medication Adjustments/Labs and Tests Ordered: Current medicines are reviewed at length with the patient today.  Concerns regarding medicines are outlined above.  Orders Placed This Encounter  Procedures  . EKG 12-Lead   No orders of the defined types were placed in this encounter.   Patient Instructions  Medication Instructions:  The current  medical regimen is effective;  continue present plan and medications.  *If you need a refill  on your cardiac medications before your next appointment, please call your pharmacy*   Lab Work: Come in and have Lipid blood work-   If you have labs (blood work) drawn today and your tests are completely normal, you will receive your results only by: Marland Kitchen MyChart Message (if you have MyChart) OR . A paper copy in the mail If you have any lab test that is abnormal or we need to change your treatment, we will call you to review the results.   Follow-Up: At [facility], you and your health needs are our priority.  As part of our continuing mission to provide you with exceptional heart care, we have created designated Provider Care Teams.  These Care Teams include your primary Cardiologist (physician) and Advanced Practice Providers (APPs -  Physician Assistants and Nurse Practitioners) who all work together to provide you with the care you need, when you need it.  We recommend signing up for the patient portal called "MyChart".  Sign up information is provided on this After Visit Summary.  MyChart is used to connect with patients for Virtual Visits (Telemedicine).  Patients are able to view lab/test results, encounter notes, upcoming appointments, etc.  Non-urgent messages can be sent to your provider as well.   To learn more about what you can do with MyChart, go to [email or web address].    Your next appointment:   3 month(s)  The format for your next appointment:   In Person  Provider:   Eleonore Chiquito, MD       Time Spent with Patient: I have spent a total of 25 minutes with patient reviewing hospital notes, telemetry, EKGs, labs and examining the patient as well as establishing an assessment and plan that was discussed with the patient.  > 50% of time was spent in direct patient care.  Signed, Addison Naegeli. Audie Box, [place]  [address] [place], [place] [address] [phone]  03/13/2020 2:57 PM

## 2020-03-13 ENCOUNTER — Other Ambulatory Visit: Payer: Self-pay

## 2020-03-13 ENCOUNTER — Ambulatory Visit (INDEPENDENT_AMBULATORY_CARE_PROVIDER_SITE_OTHER): Payer: Medicare Other | Admitting: Cardiovascular Disease

## 2020-03-13 ENCOUNTER — Encounter: Payer: Self-pay | Admitting: Cardiovascular Disease

## 2020-03-13 VITALS — BP 118/72 | HR 60 | Ht 73.0 in | Wt 227.0 lb

## 2020-03-13 DIAGNOSIS — E782 Mixed hyperlipidemia: Secondary | ICD-10-CM

## 2020-03-13 DIAGNOSIS — I2510 Atherosclerotic heart disease of native coronary artery without angina pectoris: Secondary | ICD-10-CM | POA: Diagnosis not present

## 2020-03-13 DIAGNOSIS — I4819 Other persistent atrial fibrillation: Secondary | ICD-10-CM

## 2020-03-13 DIAGNOSIS — I10 Essential (primary) hypertension: Secondary | ICD-10-CM

## 2020-03-13 DIAGNOSIS — I498 Other specified cardiac arrhythmias: Secondary | ICD-10-CM

## 2020-03-13 DIAGNOSIS — I491 Atrial premature depolarization: Secondary | ICD-10-CM

## 2020-03-13 NOTE — Patient Instructions (Signed)
Medication Instructions:  The current medical regimen is effective;  continue present plan and medications.  *If you need a refill on your cardiac medications before your next appointment, please call your pharmacy*   Lab Work: Come in and have Lipid blood work-   If you have labs (blood work) drawn today and your tests are completely normal, you will receive your results only by: Marland Kitchen MyChart Message (if you have MyChart) OR . A paper copy in the mail If you have any lab test that is abnormal or we need to change your treatment, we will call you to review the results.   Follow-Up: At [facility], you and your health needs are our priority.  As part of our continuing mission to provide you with exceptional heart care, we have created designated Provider Care Teams.  These Care Teams include your primary Cardiologist (physician) and Advanced Practice Providers (APPs -  Physician Assistants and Nurse Practitioners) who all work together to provide you with the care you need, when you need it.  We recommend signing up for the patient portal called "MyChart".  Sign up information is provided on this After Visit Summary.  MyChart is used to connect with patients for Virtual Visits (Telemedicine).  Patients are able to view lab/test results, encounter notes, upcoming appointments, etc.  Non-urgent messages can be sent to your provider as well.   To learn more about what you can do with MyChart, go to [email or web address].    Your next appointment:   3 month(s)  The format for your next appointment:   In Person  Provider:   Eleonore Chiquito, MD

## 2020-06-13 ENCOUNTER — Encounter: Payer: Self-pay | Admitting: Cardiovascular Disease

## 2020-06-13 ENCOUNTER — Other Ambulatory Visit: Payer: Self-pay

## 2020-06-13 ENCOUNTER — Ambulatory Visit (INDEPENDENT_AMBULATORY_CARE_PROVIDER_SITE_OTHER): Payer: Medicare Other | Admitting: Cardiovascular Disease

## 2020-06-13 VITALS — BP 126/68 | HR 60 | Ht 73.0 in | Wt 229.2 lb

## 2020-06-13 DIAGNOSIS — E782 Mixed hyperlipidemia: Secondary | ICD-10-CM

## 2020-06-13 DIAGNOSIS — I2510 Atherosclerotic heart disease of native coronary artery without angina pectoris: Secondary | ICD-10-CM | POA: Diagnosis not present

## 2020-06-13 DIAGNOSIS — I10 Essential (primary) hypertension: Secondary | ICD-10-CM

## 2020-06-13 DIAGNOSIS — R931 Abnormal findings on diagnostic imaging of heart and coronary circulation: Secondary | ICD-10-CM

## 2020-06-13 DIAGNOSIS — I4819 Other persistent atrial fibrillation: Secondary | ICD-10-CM | POA: Diagnosis not present

## 2020-06-13 DIAGNOSIS — I4891 Unspecified atrial fibrillation: Secondary | ICD-10-CM

## 2020-06-13 MED ORDER — APIXABAN 5 MG PO TABS: 5.0000 mg | ORAL_TABLET | Freq: Two times a day (BID) | ORAL | 3 refills | Status: DC

## 2020-06-13 MED ORDER — ROSUVASTATIN CALCIUM 20 MG PO TABS: 20.0000 mg | ORAL_TABLET | Freq: Every day | ORAL | 3 refills | Status: DC

## 2020-06-13 NOTE — Patient Instructions (Signed)
Medication Instructions:  Continue same medications *If you need a refill on your cardiac medications before your next appointment, please call your pharmacy*   Lab Work: None ordered   Testing/Procedures: None ordered   Follow-Up: At [facility], you and your health needs are our priority.  As part of our continuing mission to provide you with exceptional heart care, we have created designated Provider Care Teams.  These Care Teams include your primary Cardiologist (physician) and Advanced Practice Providers (APPs -  Physician Assistants and Nurse Practitioners) who all work together to provide you with the care you need, when you need it.  We recommend signing up for the patient portal called "MyChart".  Sign up information is provided on this After Visit Summary.  MyChart is used to connect with patients for Virtual Visits (Telemedicine).  Patients are able to view lab/test results, encounter notes, upcoming appointments, etc.  Non-urgent messages can be sent to your provider as well.   To learn more about what you can do with MyChart, go to [email or web address].    Your next appointment:  6 months    Call in Feb to schedule April appointment    The format for your next appointment: Office    Provider: Dr.O'Neal

## 2020-06-13 NOTE — Progress Notes (Signed)
Cardiology Office Note:   Date:  06/13/2020  NAME:  Ronald Davis    MRN: [id number] DOB:  1951-02-13   PCP:  Aletha Halim., PA-C  Cardiologist:  Evalina Field, MD   Referring MD: Aletha Halim., PA-C   Chief Complaint  Patient presents with  . Atrial Fibrillation   History of Present Illness:   Ronald Davis is a 69 y.o. male with a hx of afib s/p ablation, CAD, HLD who presents for follow-up.  He reports has been doing well.  He said to A. fib recurrences per his report.  One episode occurred after drinking several glasses of wine.  He reports he stopped drinking wine and symptoms have improved.  He is able to drink alcohol in moderation with no symptoms.  He had another symptom that was more forceful.  Apparently this occurred either blue.  There is significant stress in his life.  He is working with his granddaughter on several school projects.  He also is dealing with his wife's current illness which appears to be gastrointestinal in nature.  He has had no prolonged symptoms.  All of his episodes have occurred around symptoms of palpitations.  Apparently by the time he uses his cardia mobile device the episode has stopped.  This is good news.  He reports his sleeping is improved on trazodone.  He is going to wean off this.  Blood pressure is 126/68.  Most recent LDL cholesterol 52.  He is tolerating Crestor without any major limitations.  Weight is around 225 pounds.  He is working on exercising and dieting more.  He is able to walk 1/2 mile every other day without any major limitations.  He does get tired but this is not unexpected.  Overall denies any chest pain, shortness of breath or palpitations in office.  Appears to be doing well.   Problem List 1. Persistent Atrial Fibrillation  -TEE/DCCV 07/20/2019 -recurrence Afib 12/9 -ablation 09/04/2019 -on eliquis  2. CAD  -EVO350 2021 -negative NM MPI2015 3. HLD -T chol 99, HDL 33, triglycerides 151, LDL 52  Past Medical  History: Past Medical History:  Diagnosis Date  . Allergy   . Coronary artery disease   . Hyperlipidemia   . Low BP   . Paroxysmal atrial fibrillation (HCC)   . Sleep apnea     Past Surgical History: Past Surgical History:  Procedure Laterality Date  . ATRIAL FIBRILLATION ABLATION N/A 09/04/2019   Procedure: ATRIAL FIBRILLATION ABLATION;  Surgeon: Thompson Grayer, MD;  Location: [place] CV LAB;  Service: Cardiovascular;  Laterality: N/A;  . CARDIOVERSION N/A 07/20/2019   Procedure: CARDIOVERSION;  Surgeon: Fay Records, MD;  Location: [facility] ENDOSCOPY;  Service: Cardiovascular;  Laterality: N/A;  . COLONOSCOPY    . ears     ears pinned back  . INGUINAL HERNIA REPAIR     right  . MOLE REMOVAL    . POLYPECTOMY    . RHINOPLASTY      for sleep apnea  . TEE WITHOUT CARDIOVERSION N/A 07/20/2019   Procedure: TRANSESOPHAGEAL ECHOCARDIOGRAM (TEE);  Surgeon: Fay Records, MD;  Location: [facility] ENDOSCOPY;  Service: Cardiovascular;  Laterality: N/A;  . tonisllectomy      Current Medications: Current Meds  Medication Sig  . apixaban (ELIQUIS) 5 MG TABS tablet Take 1 tablet (5 mg total) by mouth 2 (two) times daily.  . rosuvastatin (CRESTOR) 20 MG tablet Take 1 tablet (20 mg total) by mouth at bedtime.  . tamsulosin (  FLOMAX) 0.4 MG CAPS capsule Take 0.4 mg by mouth daily.   . traZODone (DESYREL) 50 MG tablet Take 1 tablet (50 mg total) by mouth at bedtime.  . [DISCONTINUED] apixaban (ELIQUIS) 5 MG TABS tablet Take 1 tablet (5 mg total) by mouth 2 (two) times daily.  . [DISCONTINUED] rosuvastatin (CRESTOR) 20 MG tablet Take 1 tablet (20 mg total) by mouth at bedtime.     Allergies:    Naproxen, Amoxicillin, and Tomato   Social History: Social History   Socioeconomic History  . Marital status: Married    Spouse name: Not on file  . Number of children: 0  . Years of education: Not on file  . Highest education level: Not on file  Occupational History  . Not on file  Tobacco Use  .  Smoking status: Never Smoker  . Smokeless tobacco: Never Used  Substance and Sexual Activity  . Alcohol use: Yes    Comment: Rarely-   . Drug use: No  . Sexual activity: Not on file  Other Topics Concern  . Not on file  Social History Narrative   Forensic psychologist, retired    Investment banker, operational of Radio broadcast assistant Strain:   . Difficulty of Paying Living Expenses: Not on file  Food Insecurity:   . Worried About Charity fundraiser in the Last Year: Not on file  . Ran Out of Food in the Last Year: Not on file  Transportation Needs:   . Lack of Transportation (Medical): Not on file  . Lack of Transportation (Non-Medical): Not on file  Physical Activity:   . Days of Exercise per Week: Not on file  . Minutes of Exercise per Session: Not on file  Stress:   . Feeling of Stress : Not on file  Social Connections:   . Frequency of Communication with Friends and Family: Not on file  . Frequency of Social Gatherings with Friends and Family: Not on file  . Attends Religious Services: Not on file  . Active Member of Clubs or Organizations: Not on file  . Attends Archivist Meetings: Not on file  . Marital Status: Not on file     Family History: The patient's family history includes CAD in his father; Colon cancer in his maternal grandmother; Colon polyps in his brother, brother, sister, sister, sister, and sister; Prostate cancer in his father; Stroke in his father and mother.  ROS:   All other ROS reviewed and negative. Pertinent positives noted in the HPI.     EKGs/Labs/Other Studies Reviewed:   The following studies were personally reviewed by me today:  TTE 07/24/2019 1. Left ventricular ejection fraction, by visual estimation, is 60 to  65%. The left ventricle has normal function. There is no left ventricular  hypertrophy.  2. Left ventricular diastolic function could not be evaluated.  3. Global right ventricle has normal systolic function.The right    ventricular size is normal. No increase in right ventricular wall  thickness.  4. Left atrial size was normal.  5. Right atrial size was normal.  6. The mitral valve is normal in structure. Mild mitral valve  regurgitation. No evidence of mitral stenosis.  7. The tricuspid valve is normal in structure. Tricuspid valve  regurgitation is mild.  8. The aortic valve is normal in structure. Aortic valve regurgitation is  not visualized. Mild to moderate aortic valve sclerosis/calcification  without any evidence of aortic stenosis.  9. The pulmonic valve was normal in structure.  Pulmonic valve  regurgitation is not visualized.  10. Normal pulmonary artery systolic pressure.  11. The inferior vena cava is normal in size with greater than 50%  respiratory variability, suggesting right atrial pressure of 3 mmHg.   CCTA 08/31/2019 1. There is normal pulmonary vein drainage into the left atrium.  2. There is no thrombus in the left atrial appendage.  3. The esophagus runs in the left atrial midline and is not in the proximity to any of the pulmonary veins.  4. No PFO/ASD.  5. Normal coronary origin. Right dominance.  6. CAC score of 946 which is 84th percentile for age- and sex-matched controls. The study was performed without use of NTG and is insufficient for plaque evaluation.  7. Normal LV function, EF 68%.   Recent Labs: 08/27/2019: Hemoglobin 13.4; Platelets 229 08/31/2019: BUN 17; Creatinine, Ser 1.62; Potassium 4.5; Sodium 138   Recent Lipid Panel No results found for: CHOL, TRIG, HDL, CHOLHDL, VLDL, LDLCALC, LDLDIRECT  Physical Exam:   VS:  BP 126/68   Pulse 60   Ht 6\' 1"  (1.854 m)   Wt 229 lb 3.2 oz (104 kg)   SpO2 98%   BMI 30.24 kg/m    Wt Readings from Last 3 Encounters:  06/13/20 229 lb 3.2 oz (104 kg)  03/13/20 (!) 227 lb (103 kg)  02/12/20 232 lb (105.2 kg)    General: Well nourished, well developed, in no acute distress Heart: Atraumatic,  normal size  Eyes: PEERLA, EOMI  Neck: Supple, no JVD Endocrine: No thryomegaly Cardiac: Normal S1, S2; RRR; no murmurs, rubs, or gallops Lungs: Clear to auscultation bilaterally, no wheezing, rhonchi or rales  Abd: Soft, nontender, no hepatomegaly  Ext: No edema, pulses 2+ Musculoskeletal: No deformities, BUE and BLE strength normal and equal Skin: Warm and dry, no rashes   Neuro: Alert and oriented to person, place, time, and situation, CNII-XII grossly intact, no focal deficits  Psych: Normal mood and affect   ASSESSMENT:   Ronald Davis is a 69 y.o. male who presents for the following: 1. Persistent atrial fibrillation ([place])   2. Coronary artery disease involving native coronary artery of native heart without angina pectoris   3. Mixed hyperlipidemia   4. Essential hypertension   5. Atrial fibrillation, unspecified type ([place])   6. Elevated coronary artery calcium score     PLAN:   1. Persistent atrial fibrillation (HCC) -Status post A. fib ablation.  Was intolerant of flecainide.  Doing well.  No prolonged recurrence.  Has had some brief episodes but nothing captured on his cardia mobile device.  I think this is good news. -He will continue with diet and exercise.  Weight loss encouraged.  This will help maintain normal rhythm. -Also instructed to drink alcohol in moderation.  This is a big trigger for him. -He will continue Eliquis 5 mg twice daily. -Medications refilled today.  2. Coronary artery disease involving native coronary artery of native heart without angina pectoris -Elevated coronary calcium score.  Most recent LDL cholesterol 52.  Continue Crestor.  No symptoms of angina.  3. Mixed hyperlipidemia -Most recent LDL cholesterol is at goal.  Medications refilled today.  4. Essential hypertension -BP stable on no meds.  Disposition: Return in about 6 months (around 12/12/2020).  Medication Adjustments/Labs and Tests Ordered: Current medicines are reviewed at  length with the patient today.  Concerns regarding medicines are outlined above.  No orders of the defined types were placed in this encounter.  Meds  ordered this encounter  Medications  . apixaban (ELIQUIS) 5 MG TABS tablet    Sig: Take 1 tablet (5 mg total) by mouth 2 (two) times daily.    Dispense:  180 tablet    Refill:  3  . rosuvastatin (CRESTOR) 20 MG tablet    Sig: Take 1 tablet (20 mg total) by mouth at bedtime.    Dispense:  90 tablet    Refill:  3    Patient Instructions  Medication Instructions:  Continue same medications *If you need a refill on your cardiac medications before your next appointment, please call your pharmacy*   Lab Work: None ordered   Testing/Procedures: None ordered   Follow-Up: At [facility], you and your health needs are our priority.  As part of our continuing mission to provide you with exceptional heart care, we have created designated Provider Care Teams.  These Care Teams include your primary Cardiologist (physician) and Advanced Practice Providers (APPs -  Physician Assistants and Nurse Practitioners) who all work together to provide you with the care you need, when you need it.  We recommend signing up for the patient portal called "MyChart".  Sign up information is provided on this After Visit Summary.  MyChart is used to connect with patients for Virtual Visits (Telemedicine).  Patients are able to view lab/test results, encounter notes, upcoming appointments, etc.  Non-urgent messages can be sent to your provider as well.   To learn more about what you can do with MyChart, go to [email or web address].    Your next appointment:  6 months    Call in Feb to schedule April appointment    The format for your next appointment: Office    Provider: Dr.O'Neal       Time Spent with Patient: I have spent a total of 35 minutes with patient reviewing hospital notes, telemetry, EKGs, labs and examining the patient as well as  establishing an assessment and plan that was discussed with the patient.  > 50% of time was spent in direct patient care.  Signed, Addison Naegeli. Audie Box, [place]  [address], [place] [address] [phone]  06/13/2020 9:54 AM

## 2020-06-23 ENCOUNTER — Telehealth: Payer: Self-pay | Admitting: Gastroenterology

## 2020-08-05 ENCOUNTER — Ambulatory Visit: Payer: Medicare Other | Admitting: Cardiovascular Disease

## 2020-08-23 DIAGNOSIS — I4891 Unspecified atrial fibrillation: Secondary | ICD-10-CM

## 2020-08-25 MED ORDER — APIXABAN 5 MG PO TABS: 5.0000 mg | ORAL_TABLET | Freq: Two times a day (BID) | ORAL | 3 refills | Status: DC

## 2020-12-10 ENCOUNTER — Ambulatory Visit: Payer: Medicare Other | Admitting: Cardiovascular Disease

## 2020-12-14 NOTE — Progress Notes (Signed)
Cardiology Office Note:   Date:  12/15/2020  NAME:  Ronald Davis    MRN: [id number] DOB:  August 03, 1951   PCP:  Aletha Halim., PA-C  Cardiologist:  Evalina Field, MD  Electrophysiologist:  None   Referring MD: Aletha Halim., PA-C   Chief Complaint  Patient presents with  . Follow-up   History of Present Illness:   Ronald Davis is a 70 y.o. male with a hx of persistent Afib s/p ablation, CAD, HLD who presents for follow-up.  He is doing well.  He denies any A. fib symptoms.  No palpitations or shortness of breath.  He denies any chest pain.  He apparently is remodeling his house.  He is doing a lot of heavy lifting as well as some Architect work.  He did cut his arm.  I did inform him that he needs to be aware he is on Eliquis.  No issues with the medication otherwise.  He will be more careful.  He overall is doing well without any symptoms of chest pain or shortness of breath.  His cholesterol level was at goal at her last visit.  He overall is doing really well.  His wife may need a cardiologist.  I have offered our assistance.  Problem List 1. Persistent Atrial Fibrillation  -TEE/DCCV 07/20/2019 -recurrence Afib 12/9 -ablation 09/04/2019 -on eliquis  2. CAD  -CAC946 (84th percentile) 09/01/2019 -negative NM MPI2015 3. HLD -T chol 99, HDL 33, triglycerides 151, LDL 52  Past Medical History: Past Medical History:  Diagnosis Date  . Allergy   . Coronary artery disease   . Hyperlipidemia   . Low BP   . Paroxysmal atrial fibrillation (HCC)   . Sleep apnea     Past Surgical History: Past Surgical History:  Procedure Laterality Date  . ATRIAL FIBRILLATION ABLATION N/A 09/04/2019   Procedure: ATRIAL FIBRILLATION ABLATION;  Surgeon: Thompson Grayer, MD;  Location: [place] CV LAB;  Service: Cardiovascular;  Laterality: N/A;  . CARDIOVERSION N/A 07/20/2019   Procedure: CARDIOVERSION;  Surgeon: Fay Records, MD;  Location[facility] ENDOSCOPY;  Service: Cardiovascular;   Laterality: N/A;  . COLONOSCOPY    . ears     ears pinned back  . INGUINAL HERNIA REPAIR     right  . MOLE REMOVAL    . POLYPECTOMY    . RHINOPLASTY      for sleep apnea  . TEE WITHOUT CARDIOVERSION N/A 07/20/2019   Procedure: TRANSESOPHAGEAL ECHOCARDIOGRAM (TEE);  Surgeon: Fay Records, MD;  Location: [facility] ENDOSCOPY;  Service: Cardiovascular;  Laterality: N/A;  . tonisllectomy      Current Medications: Current Meds  Medication Sig  . rosuvastatin (CRESTOR) 20 MG tablet Take 1 tablet (20 mg total) by mouth at bedtime.  . tamsulosin (FLOMAX) 0.4 MG CAPS capsule Take 0.4 mg by mouth daily.   . traZODone (DESYREL) 50 MG tablet Take 1 tablet (50 mg total) by mouth at bedtime.     Allergies:    Naproxen, Amoxicillin, and Tomato   Social History: Social History   Socioeconomic History  . Marital status: Married    Spouse name: Not on file  . Number of children: 0  . Years of education: Not on file  . Highest education level: Not on file  Occupational History  . Not on file  Tobacco Use  . Smoking status: Never Smoker  . Smokeless tobacco: Never Used  Substance and Sexual Activity  . Alcohol use: Yes  Comment: Rarely-   . Drug use: No  . Sexual activity: Not on file  Other Topics Concern  . Not on file  Social History Narrative   Forensic psychologist, retired    Investment banker, operational of Radio broadcast assistant Strain: Not on [employer] Insecurity: Not on file  Transportation Needs: Not on file  Physical Activity: Not on file  Stress: Not on file  Social Connections: Not on file     Family History: The patient's family history includes CAD in his father; Colon cancer in his maternal grandmother; Colon polyps in his brother, brother, sister, sister, sister, and sister; Prostate cancer in his father; Stroke in his father and mother.  ROS:   All other ROS reviewed and negative. Pertinent positives noted in the HPI.     EKGs/Labs/Other Studies Reviewed:   The  following studies were personally reviewed by me today:  Recent Labs: No results found for requested labs within last 8760 hours.   Recent Lipid Panel No results found for: CHOL, TRIG, HDL, CHOLHDL, VLDL, LDLCALC, LDLDIRECT  Physical Exam:   VS:  BP 132/60   Pulse (!) 52   Ht 6\' 1"  (1.854 m)   Wt 229 lb 12.8 oz (104.2 kg)   SpO2 96%   BMI 30.32 kg/m    Wt Readings from Last 3 Encounters:  12/15/20 229 lb 12.8 oz (104.2 kg)  06/13/20 229 lb 3.2 oz (104 kg)  03/13/20 (!) 227 lb (103 kg)    General: Well nourished, well developed, in no acute distress Head: Atraumatic, normal size  Eyes: PEERLA, EOMI  Neck: Supple, no JVD Endocrine: No thryomegaly Cardiac: Normal S1, S2; RRR; no murmurs, rubs, or gallops Lungs: Clear to auscultation bilaterally, no wheezing, rhonchi or rales  Abd: Soft, nontender, no hepatomegaly  Ext: No edema, pulses 2+ Musculoskeletal: No deformities, BUE and BLE strength normal and equal Skin: Warm and dry, no rashes   Neuro: Alert and oriented to person, place, time, and situation, CNII-XII grossly intact, no focal deficits  Psych: Normal mood and affect   ASSESSMENT:   Ronald Davis is a 70 y.o. male who presents for the following: 1. Persistent atrial fibrillation ([place])   2. Coronary artery disease involving native coronary artery of native heart without angina pectoris   3. Agatston coronary artery calcium score greater than 400   4. Mixed hyperlipidemia     PLAN:   1. Persistent atrial fibrillation (HCC) -CHADSVASC = 2 (age, CAD) -Status post A. fib ablation.  Doing well.  No A. fib recurrence.  He will continue Eliquis 5 mg twice daily.  2. Coronary artery disease involving native coronary artery of native heart without angina pectoris 3. Agatston coronary artery calcium score greater than 400 4. Mixed hyperlipidemia -Elevated coronary calcium score.  No evidence of obstructive CAD.  No symptoms concerning for this either.  He will continue  his Crestor 20 mg daily.  Most recent LDL cholesterol at goal. -Counseled extensively on proper diet and exercise goals.  Recommend he become a little more active.  Disposition: Return in about 1 year (around 12/15/2021).  Medication Adjustments/Labs and Tests Ordered: Current medicines are reviewed at length with the patient today.  Concerns regarding medicines are outlined above.  No orders of the defined types were placed in this encounter.  No orders of the defined types were placed in this encounter.   Patient Instructions  Medication Instructions:  The current medical regimen is effective;  continue present plan  and medications.  *If you need a refill on your cardiac medications before your next appointment, please call your pharmacy*   Follow-Up: At [facility], you and your health needs are our priority.  As part of our continuing mission to provide you with exceptional heart care, we have created designated Provider Care Teams.  These Care Teams include your primary Cardiologist (physician) and Advanced Practice Providers (APPs -  Physician Assistants and Nurse Practitioners) who all work together to provide you with the care you need, when you need it.  We recommend signing up for the patient portal called "MyChart".  Sign up information is provided on this After Visit Summary.  MyChart is used to connect with patients for Virtual Visits (Telemedicine).  Patients are able to view lab/test results, encounter notes, upcoming appointments, etc.  Non-urgent messages can be sent to your provider as well.   To learn more about what you can do with MyChart, go to [email or web address].    Your next appointment:   12 month(s)  The format for your next appointment:   In Person  Provider:   Eleonore Chiquito, MD       Time Spent with Patient: I have spent a total of 25 minutes with patient reviewing hospital notes, telemetry, EKGs, labs and examining the patient as well as  establishing an assessment and plan that was discussed with the patient.  > 50% of time was spent in direct patient care.  Signed, Addison Naegeli. Audie Box, MD, [place]  [address], [place], [place] [address] [phone]  12/15/2020 9:58 AM

## 2020-12-15 ENCOUNTER — Ambulatory Visit (INDEPENDENT_AMBULATORY_CARE_PROVIDER_SITE_OTHER): Payer: Medicare Other | Admitting: Cardiovascular Disease

## 2020-12-15 ENCOUNTER — Encounter: Payer: Self-pay | Admitting: Cardiovascular Disease

## 2020-12-15 ENCOUNTER — Other Ambulatory Visit: Payer: Self-pay

## 2020-12-15 VITALS — BP 132/60 | HR 52 | Ht 73.0 in | Wt 229.8 lb

## 2020-12-15 DIAGNOSIS — E782 Mixed hyperlipidemia: Secondary | ICD-10-CM

## 2020-12-15 DIAGNOSIS — I4819 Other persistent atrial fibrillation: Secondary | ICD-10-CM | POA: Diagnosis not present

## 2020-12-15 DIAGNOSIS — R931 Abnormal findings on diagnostic imaging of heart and coronary circulation: Secondary | ICD-10-CM

## 2020-12-15 DIAGNOSIS — I2510 Atherosclerotic heart disease of native coronary artery without angina pectoris: Secondary | ICD-10-CM

## 2020-12-15 NOTE — Patient Instructions (Signed)

## 2021-02-10 ENCOUNTER — Other Ambulatory Visit: Payer: Self-pay | Admitting: Cardiovascular Disease

## 2021-02-25 ENCOUNTER — Encounter: Payer: Self-pay | Admitting: Gastroenterology

## 2021-04-06 ENCOUNTER — Encounter: Payer: Self-pay | Admitting: Gastroenterology

## 2021-04-06 ENCOUNTER — Ambulatory Visit (INDEPENDENT_AMBULATORY_CARE_PROVIDER_SITE_OTHER): Payer: Medicare Other | Admitting: Gastroenterology

## 2021-04-06 ENCOUNTER — Telehealth: Payer: Self-pay

## 2021-04-06 VITALS — BP 120/64 | HR 63 | Ht 73.0 in | Wt 238.0 lb

## 2021-04-06 DIAGNOSIS — Z86010 Personal history of colonic polyps: Secondary | ICD-10-CM | POA: Diagnosis not present

## 2021-04-06 DIAGNOSIS — Z8371 Family history of colonic polyps: Secondary | ICD-10-CM | POA: Diagnosis not present

## 2021-04-06 DIAGNOSIS — Z7901 Long term (current) use of anticoagulants: Secondary | ICD-10-CM

## 2021-04-06 DIAGNOSIS — Z01818 Encounter for other preprocedural examination: Secondary | ICD-10-CM

## 2021-04-06 DIAGNOSIS — I2510 Atherosclerotic heart disease of native coronary artery without angina pectoris: Secondary | ICD-10-CM

## 2021-04-06 DIAGNOSIS — Z800 Family history of malignant neoplasm of digestive organs: Secondary | ICD-10-CM

## 2021-04-06 MED ORDER — PLENVU 140 G PO SOLR: 1.0000 | Freq: Once | ORAL | 0 refills | Status: AC

## 2021-04-06 NOTE — Telephone Encounter (Signed)
   Name: Ronald Davis  DOB: 07/24/1951  MRN: [id number]   Primary Cardiologist: Evalina Field, MD  Chart reviewed as part of pre-operative protocol coverage. Patient was contacted 04/06/2021 in reference to pre-operative risk assessment for pending surgery as outlined below.  Ronald Davis was last seen on 12/2020 by Dr. Marisue Ivan with pertinent CV hx to include atrial fib s/p ablation, CAD by calcium score (negative MPI 2015), HLD. 2D echo 2020 EF normal, mild MR. I reached out to patient for update on how he is doing. The patient affirms he has been doing well without any new cardiac symptoms. He has been working on home renovations without cardiac symptoms. Therefore, based on ACC/AHA guidelines, the patient would be at acceptable risk for the planned procedure without further cardiovascular testing. The patient was advised that if he develops new symptoms prior to surgery to contact our office to arrange for a follow-up visit, and he verbalized understanding.  Per pharmD review, per office protocol, patient can hold Eliquis for 2 days prior to procedure as requested. Confirmed Eliquis dose updated in MAR (was missing sig of '5mg'$  BID).  I will route this recommendation to the requesting party via Epic fax function and remove from pre-op pool. Please call with questions.  Charlie Pitter, PA-C 04/06/2021, 4:50 PM

## 2021-04-06 NOTE — Telephone Encounter (Signed)
Covering preop today. Will route to pharmacy team for input on anticoagulation then pt will need call.

## 2021-04-06 NOTE — Patient Instructions (Signed)
You have been scheduled for a colonoscopy. Please follow written instructions given to you at your visit today.  °Please pick up your prep supplies at the pharmacy within the next 1-3 days. °If you use inhalers (even only as needed), please bring them with you on the day of your procedure. ° °Due to recent changes in healthcare laws, you may see the results of your imaging and laboratory studies on MyChart before your provider has had a chance to review them.  We understand that in some cases there may be results that are confusing or concerning to you. Not all laboratory results come back in the same time frame and the provider may be waiting for multiple results in order to interpret others.  Please give us 48 hours in order for your provider to thoroughly review all the results before contacting the office for clarification of your results.  ° °The [facility] providers would like to encourage you to use MYCHART to communicate with providers for non-urgent requests or questions.  Due to long hold times on the telephone, sending your provider a message by MYCHART may be a faster and more efficient way to get a response.  Please allow 48 business hours for a response.  Please remember that this is for non-urgent requests.  ° °Thank you for choosing me and [facility]. ° °Malcolm T. Stark, Jr., MD., FACG ° °

## 2021-04-06 NOTE — Progress Notes (Signed)
History of Present Illness: This is a 70 year old male referred by Aletha Halim., PA-C for the evaluation of personal history of adenomatous colon polyps and family history of colon polyps.  Last colonoscopy as below.  Multiple siblings with colon polyps.  His maternal grandmother had colon cancer.  He has no ongoing colorectal complaints.  He is maintained on Eliquis for history of A. fib.  No GI complaints. Denies weight loss, abdominal pain, constipation, diarrhea, change in stool caliber, melena, hematochezia, nausea, vomiting, dysphagia, reflux symptoms, chest pain.   Colonoscopy July 2017 - One 8 mm polyp in the rectum, removed with a cold snare. Resected and retrieved. - Two 4 to 5 mm polyps in the sigmoid colon, removed with a cold biopsy forceps. Resected and retrieved. - Moderate diverticulosis in the sigmoid colon and in the descending colon. - The distal rectum and anal verge are normal on retroflexion view. Surgical [P], rectum and sigmoid, polyp (3) - TUBULAR ADENOMA(S) (MULTIPLE FRAGMENTS). - HYPERPLASTIC POLYP(S) (MULTIPLE FRAGMENTS). - HIGH GRADE DYSPLASIA IS NOT IDENTIFIED.    Allergies  Allergen Reactions   Naproxen Other (See Comments)    GI bleed   Amoxicillin Diarrhea    Did it involve swelling of the face/tongue/throat, SOB, or low BP? No Did it involve sudden or severe rash/hives, skin peeling, or any reaction on the inside of your mouth or nose? No Did you need to seek medical attention at a hospital or doctor's office? No When did it last happen? more than 15 years  If all above answers are "NO", may proceed with cephalosporin use.    Tomato Other (See Comments)    Eczema, with red tomatoes only    Outpatient Medications Prior to Visit  Medication Sig Dispense Refill   apixaban (ELIQUIS) 5 MG TABS tablet      rosuvastatin (CRESTOR) 20 MG tablet Take 1 tablet (20 mg total) by mouth at bedtime. 90 tablet 3   tamsulosin (FLOMAX) 0.4 MG CAPS capsule  Take 0.4 mg by mouth daily.      traZODone (DESYREL) 50 MG tablet TAKE 1 TABLET BY MOUTH EVERYDAY AT BEDTIME 90 tablet 3   No facility-administered medications prior to visit.   Past Medical History:  Diagnosis Date   Allergy    Coronary artery disease    Hyperlipidemia    Low BP    Paroxysmal atrial fibrillation ([place])    Sleep apnea    Past Surgical History:  Procedure Laterality Date   ATRIAL FIBRILLATION ABLATION N/A 09/04/2019   Procedure: ATRIAL FIBRILLATION ABLATION;  Surgeon: Thompson Grayer, MD;  Location: [facility];  Service: Cardiovascular;  Laterality: N/A;   CARDIOVERSION N/A 07/20/2019   Procedure: CARDIOVERSION;  Surgeon: Fay Records, MD;  Location: [facility];  Service: Cardiovascular;  Laterality: N/A;   COLONOSCOPY     ears     ears pinned back   INGUINAL HERNIA REPAIR     right   MOLE REMOVAL     POLYPECTOMY     RHINOPLASTY      for sleep apnea   TEE WITHOUT CARDIOVERSION N/A 07/20/2019   Procedure: TRANSESOPHAGEAL ECHOCARDIOGRAM (TEE);  Surgeon: Fay Records, MD;  Location: [facility];  Service: Cardiovascular;  Laterality: N/A;   tonisllectomy     Social History   Socioeconomic History   Marital status: Married    Spouse name: Not on file   Number of children: 0   Years of education: Not on file  Highest education level: Not on file  Occupational History   Not on file  Tobacco Use   Smoking status: Never   Smokeless tobacco: Never  Vaping Use   Vaping Use: Never used  Substance and Sexual Activity   Alcohol use: Yes    Comment: 1 beer a day   Drug use: No   Sexual activity: Not on file  Other Topics Concern   Not on file  Social History Narrative   Forensic psychologist, retired    Investment banker, operational of Radio broadcast assistant Strain: Not on file  Food Insecurity: Not on file  Transportation Needs: Not on file  Physical Activity: Not on file  Stress: Not on file  Social Connections: Not on file   Family History   Problem Relation Age of Onset   Colon cancer Maternal Grandmother    Stroke Mother    Prostate cancer Father    CAD Father        CABG   Stroke Father    Colon polyps Sister    Colon polyps Brother    Colon polyps Sister    Colon polyps Sister    Colon polyps Sister    Colon polyps Brother        Review of Systems: Pertinent positive and negative review of systems were noted in the above HPI section. All other review of systems were otherwise negative.   Physical Exam: General: Well developed, well nourished, no acute distress Head: Normocephalic and atraumatic Eyes: Sclerae anicteric, EOMI Ears: Normal auditory acuity Mouth: Not examined, mask on during Covid-19 pandemic Neck: Supple, no masses or thyromegaly Lungs: Clear throughout to auscultation Heart: Regular rate and rhythm; no murmurs, rubs or bruits Abdomen: Soft, non tender and non distended. No masses, hepatosplenomegaly or hernias noted. Normal Bowel sounds Rectal: Deferred to colonoscopy. Musculoskeletal: Symmetrical with no gross deformities  Skin: No lesions on visible extremities Pulses:  Normal pulses noted Extremities: No clubbing, cyanosis, edema or deformities noted Neurological: Alert oriented x 4, grossly nonfocal Cervical Nodes:  No significant cervical adenopathy Inguinal Nodes: No significant inguinal adenopathy Psychological:  Alert and cooperative. Normal mood and affect   Assessment and Recommendations:  Personal history of adenomatous colon polyps.  Multiple siblings with adenomatous colon polyps.  He is due for surveillance colonoscopy.  He is due for colonoscopy. Schedule colonoscopy. The risks (including bleeding, perforation, infection, missed lesions, medication reactions and possible hospitalization or surgery if complications occur), benefits, and alternatives to colonoscopy with possible biopsy and possible polypectomy were discussed with the patient and they consent to proceed.    Paroxysmal afib. Hold Elqiuis 2 days before procedure - will instruct when and how to resume after procedure. Low but real risk of cardiovascular event such as heart attack, stroke, embolism, thrombosis or ischemia/infarct of other organs off Eliquis explained and need to seek urgent help if this occurs. The patient consents to proceed. Will communicate by phone or EMR with patient's prescribing provider to confirm that holding Eliquis is reasonable in this case.     cc: Aletha Halim., PA-C [address]  [place] [id number]

## 2021-04-06 NOTE — Telephone Encounter (Signed)
Patient with diagnosis of afib on Eliquis for anticoagulation.    Procedure: colonoscopy Date of procedure: 04/15/21  CHA2DS2-VASc Score = 2  This indicates a 2.2% annual risk of stroke. The patient's score is based upon: CHF History: No HTN History: No Diabetes History: No Stroke History: No Vascular Disease History: Yes Age Score: 1 Gender Score: 0   CrCl 62m/min using adjusted body weight Platelet count 199K  Per office protocol, patient can hold Eliquis for 2 days prior to procedure as requested.

## 2021-04-06 NOTE — Telephone Encounter (Signed)
[facility] Pre-operative Risk Assessment     Request for surgical clearance:     Endoscopy Procedure  What type of surgery is being performed?     Colonoscopy  When is this surgery scheduled?     04/15/21  What type of clearance is required ?   Pharmacy  Are there any medications that need to be held prior to surgery and how long? Eliquis x 2 days  Practice name and name of physician performing surgery?      [facility]  What is your office phone and fax number?      Phone- [phone]  [phone]  Anesthesia type (None, local, MAC, general) ?       MAC

## 2021-04-07 NOTE — Telephone Encounter (Signed)
Patient notified to hold Eliquis 2 days prior to his procedure.

## 2021-04-15 ENCOUNTER — Encounter: Payer: Self-pay | Admitting: Gastroenterology

## 2021-04-15 ENCOUNTER — Other Ambulatory Visit: Payer: Self-pay

## 2021-04-15 ENCOUNTER — Ambulatory Visit (AMBULATORY_SURGERY_CENTER): Payer: Medicare Other | Admitting: Gastroenterology

## 2021-04-15 VITALS — BP 122/59 | HR 60 | Temp 96.9°F | Resp 14 | Ht 73.0 in | Wt 238.0 lb

## 2021-04-15 DIAGNOSIS — Z86010 Personal history of colonic polyps: Secondary | ICD-10-CM

## 2021-04-15 DIAGNOSIS — D125 Benign neoplasm of sigmoid colon: Secondary | ICD-10-CM

## 2021-04-15 DIAGNOSIS — D123 Benign neoplasm of transverse colon: Secondary | ICD-10-CM

## 2021-04-15 MED ORDER — SODIUM CHLORIDE 0.9 % IV SOLN: 500.0000 mL | Freq: Once | INTRAVENOUS | Status: DC

## 2021-04-15 NOTE — Op Note (Addendum)
[facility] Patient Name: Ronald Davis Procedure Date: 04/15/2021 1:58 PM MRN: [id number] Endoscopist: Ladene Artist , MD Age: 70 Referring MD:  Date of Birth: November 23, 1950 Gender: Male Account #: [id number] Procedure:                Colonoscopy Indications:              Surveillance: Personal history of adenomatous                            polyps on last colonoscopy 5 years ago Medicines:                Monitored Anesthesia Care Procedure:                Pre-Anesthesia Assessment:                           - Prior to the procedure, a History and Physical                            was performed, and patient medications and                            allergies were reviewed. The patient's tolerance of                            previous anesthesia was also reviewed. The risks                            and benefits of the procedure and the sedation                            options and risks were discussed with the patient.                            All questions were answered, and informed consent                            was obtained. Prior Anticoagulants: The patient has                            taken Eliquis (apixaban), last dose was 2 days                            prior to procedure. ASA Grade Assessment: III - A                            patient with severe systemic disease. After                            reviewing the risks and benefits, the patient was                            deemed in satisfactory condition to undergo the  procedure.                           After obtaining informed consent, the colonoscope                            was passed under direct vision. Throughout the                            procedure, the patient's blood pressure, pulse, and                            oxygen saturations were monitored continuously. The                            Olympus CF-HQ190L ([id number]) Colonoscope was                             introduced through the anus and advanced to the the                            cecum, identified by appendiceal orifice and                            ileocecal valve. The ileocecal valve, appendiceal                            orifice, and rectum were photographed. The quality                            of the bowel preparation was adequate after                            extensive lavage, suction. The colonoscopy was                            performed without difficulty. The patient tolerated                            the procedure well. Scope In: 2:10:32 PM Scope Out: 2:28:55 PM Scope Withdrawal Time: 0 hours 15 minutes 33 seconds  Total Procedure Duration: 0 hours 18 minutes 23 seconds  Findings:                 The perianal and digital rectal examinations were                            normal.                           Two sessile polyps were found in the sigmoid colon                            and hepatic flexure. The polyps were 6 to 7 mm in  size. These polyps were removed with a cold snare.                            Resection and retrieval were complete.                           Multiple medium-mouthed diverticula were found in                            the left colon. There was no evidence of                            diverticular bleeding.                           External hemorrhoids were found during                            retroflexion. The hemorrhoids were moderate.                           The exam was otherwise without abnormality on                            direct and retroflexion views. Complications:            No immediate complications. Estimated blood loss:                            None. Estimated Blood Loss:     Estimated blood loss: none. Impression:               - Two 6-7 mm polyps in the sigmoid colon and at the                            hepatic flexure, removed with a cold snare.                             Resected and retrieved.                           - Moderate diverticulosis in the left colon.                           - External hemorrhoids.                           - The examination was otherwise normal on direct                            and retroflexion views. Recommendation:           - Repeat colonoscopy, vs. no repeat due to age,                            after studies are complete for surveillance based  on pathology results with a more extensive bowel                            prep.                           - Patient has a contact number available for                            emergencies. The signs and symptoms of potential                            delayed complications were discussed with the                            patient. Return to normal activities tomorrow.                            Written discharge instructions were provided to the                            patient.                           - High fiber diet.                           - Continue present medications.                           - Await pathology results.                           - Resume Eliquis (apixaban) at prior dose in 2                            days. Refer to managing physician for further                            adjustment of therapy.                           - No aspirin, ibuprofen, naproxen, or other                            non-steroidal anti-inflammatory drugs for 2 weeks                            after polyp removal. Ladene Artist, MD 04/15/2021 2:33:55 PM This report has been signed electronically.

## 2021-04-15 NOTE — Progress Notes (Signed)
VS- Ronald Davis. °

## 2021-04-15 NOTE — Progress Notes (Signed)
See 04/06/2021 H&P, no changes. This patient is appropriate for endoscopic procedures in the ambulatory setting.

## 2021-04-15 NOTE — Progress Notes (Signed)
Report to PACU, RN, vss, BBS= Clear.  

## 2021-04-15 NOTE — Progress Notes (Signed)
Called to room to assist during endoscopic procedure.  Patient ID and intended procedure confirmed with present staff. Received instructions for my participation in the procedure from the performing physician.  

## 2021-04-15 NOTE — Patient Instructions (Signed)
Handout on polyps, diverticulosis given.  High fiber diet.  Resume Eliquis at prior dose in 2 days.  No aspirin, ibuprofen, naproxen, or other non-steroidal anti-inflammatory drugs for 2 weeks after polyp removed.   YOU HAD AN ENDOSCOPIC PROCEDURE TODAY AT [facility]:   Refer to the procedure report that was given to you for any specific questions about what was found during the examination.  If the procedure report does not answer your questions, please call your gastroenterologist to clarify.  If you requested that your care partner not be given the details of your procedure findings, then the procedure report has been included in a sealed envelope for you to review at your convenience later.  YOU SHOULD EXPECT: Some feelings of bloating in the abdomen. Passage of more gas than usual.  Walking can help get rid of the air that was put into your GI tract during the procedure and reduce the bloating. If you had a lower endoscopy (such as a colonoscopy or flexible sigmoidoscopy) you may notice spotting of blood in your stool or on the toilet paper. If you underwent a bowel prep for your procedure, you may not have a normal bowel movement for a few days.  Please Note:  You might notice some irritation and congestion in your nose or some drainage.  This is from the oxygen used during your procedure.  There is no need for concern and it should clear up in a day or so.  SYMPTOMS TO REPORT IMMEDIATELY:  Following lower endoscopy (colonoscopy or flexible sigmoidoscopy):  Excessive amounts of blood in the stool  Significant tenderness or worsening of abdominal pains  Swelling of the abdomen that is new, acute  Fever of 100F or higher   For urgent or emergent issues, a gastroenterologist can be reached at any hour by calling [phone]. Do not use MyChart messaging for urgent concerns.    DIET:  We do recommend a small meal at first, but then you may proceed to your regular diet.   Drink plenty of fluids but you should avoid alcoholic beverages for 24 hours.  ACTIVITY:  You should plan to take it easy for the rest of today and you should NOT DRIVE or use heavy machinery until tomorrow (because of the sedation medicines used during the test).    FOLLOW UP: Our staff will call the number listed on your records 48-72 hours following your procedure to check on you and address any questions or concerns that you may have regarding the information given to you following your procedure. If we do not reach you, we will leave a message.  We will attempt to reach you two times.  During this call, we will ask if you have developed any symptoms of COVID 19. If you develop any symptoms (ie: fever, flu-like symptoms, shortness of breath, cough etc.) before then, please call [phone].  If you test positive for Covid 19 in the 2 weeks post procedure, please call and report this information to [place].    If any biopsies were taken you will be contacted by phone or by letter within the next 1-3 weeks.  Please call us at [phone] if you have not heard about the biopsies in 3 weeks.    SIGNATURES/CONFIDENTIALITY: You and/or your care partner have signed paperwork which will be entered into your electronic medical record.  These signatures attest to the fact that that the information above on your After Visit Summary has been reviewed and is  understood.  Full responsibility of the confidentiality of this discharge information lies with you and/or your care-partner.

## 2021-04-17 ENCOUNTER — Telehealth: Payer: Self-pay

## 2021-04-17 NOTE — Telephone Encounter (Signed)
  Follow up Call-  Call back number 04/15/2021  Post procedure Call Back phone  # [phone] (wife's cell)  Permission to leave phone message Yes  Some recent data might be hidden     Patient questions:  Do you have a fever, pain , or abdominal swelling? No. Pain Score  0 *  Have you tolerated food without any problems? Yes.    Have you been able to return to your normal activities? Yes.    Do you have any questions about your discharge instructions: Diet   No. Medications  No. Follow up visit  No.  Do you have questions or concerns about your Care? No.  Actions: * If pain score is 4 or above: No action needed, pain <4.  Have you developed a fever since your procedure? no  2.   Have you had an respiratory symptoms (SOB or cough) since your procedure? no  3.   Have you tested positive for COVID 19 since your procedure no  4.   Have you had any family members/close contacts diagnosed with the COVID 19 since your procedure?  no   If yes to any of these questions please route to Joylene John, RN and Joella Prince, RN

## 2021-04-30 ENCOUNTER — Encounter: Payer: Self-pay | Admitting: Gastroenterology

## 2021-05-07 ENCOUNTER — Encounter: Payer: Medicare Other | Admitting: Gastroenterology

## 2021-05-26 MED ORDER — RIVAROXABAN 20 MG PO TABS: 20.0000 mg | ORAL_TABLET | Freq: Every day | ORAL | 2 refills | Status: DC

## 2021-07-08 ENCOUNTER — Other Ambulatory Visit: Payer: Self-pay | Admitting: Cardiovascular Disease

## 2021-07-08 DIAGNOSIS — R931 Abnormal findings on diagnostic imaging of heart and coronary circulation: Secondary | ICD-10-CM

## 2021-07-24 ENCOUNTER — Other Ambulatory Visit: Payer: Self-pay | Admitting: Family Medicine

## 2021-07-24 ENCOUNTER — Ambulatory Visit
Admission: RE | Admit: 2021-07-24 | Discharge: 2021-07-24 | Disposition: A | Discharge status: Home / Self Care | Payer: Medicare Other | Source: Ambulatory Visit | Attending: Family Medicine | Admitting: Family Medicine

## 2021-07-24 DIAGNOSIS — R058 Other specified cough: Secondary | ICD-10-CM

## 2021-09-16 ENCOUNTER — Encounter: Payer: Self-pay | Admitting: Cardiovascular Disease

## 2021-09-16 ENCOUNTER — Other Ambulatory Visit: Payer: Self-pay | Admitting: Cardiovascular Disease

## 2021-09-16 DIAGNOSIS — I4891 Unspecified atrial fibrillation: Secondary | ICD-10-CM

## 2021-09-16 MED ORDER — APIXABAN 5 MG PO TABS: 5.0000 mg | ORAL_TABLET | Freq: Two times a day (BID) | ORAL | 2 refills | Status: DC

## 2021-09-16 NOTE — Telephone Encounter (Signed)
Prescription refill request for Eliquis received. Indication:Afib Last office visit:5/22 NZV:[facility] Age: 71 Weight:108 kg  Prescription refilled

## 2021-11-13 ENCOUNTER — Other Ambulatory Visit: Payer: Self-pay | Admitting: Urology

## 2021-11-13 DIAGNOSIS — R972 Elevated prostate specific antigen [PSA]: Secondary | ICD-10-CM

## 2021-12-08 ENCOUNTER — Ambulatory Visit
Admission: RE | Admit: 2021-12-08 | Discharge: 2021-12-08 | Disposition: A | Discharge status: Home / Self Care | Payer: Medicare Other | Source: Ambulatory Visit | Attending: Urology | Admitting: Urology

## 2021-12-08 DIAGNOSIS — R972 Elevated prostate specific antigen [PSA]: Secondary | ICD-10-CM

## 2021-12-08 MED ORDER — GADOBENATE DIMEGLUMINE 529 MG/ML IV SOLN
20.0000 mL | Freq: Once | INTRAVENOUS | Status: AC | PRN
  Administered 2021-12-08: 20 mL via INTRAVENOUS

## 2022-02-08 ENCOUNTER — Other Ambulatory Visit: Payer: Self-pay | Admitting: Cardiovascular Disease

## 2022-03-11 ENCOUNTER — Other Ambulatory Visit: Payer: Self-pay | Admitting: Cardiovascular Disease

## 2022-03-11 DIAGNOSIS — I4891 Unspecified atrial fibrillation: Secondary | ICD-10-CM

## 2022-03-11 NOTE — Telephone Encounter (Signed)
Prescription refill request for Eliquis received. Indication: Afib  Last office visit: 12/15/20 ([place])  Scr: 1.29 (06/12/20)  Age: 71 Weight: 108kg  Pt overdue for labs and office visit with cardiologist. Called and spoke with pt's wife. She stated pt is still asleep but will call back as soon as possible to schedule labs and appt with cardiologist.

## 2022-03-11 NOTE — Telephone Encounter (Signed)
Received call from Mr Lysaght stating he missed recent appts with cardiologist and PCP and got behind on blood work recently.  I explained that he must see cardiologist once a year and have labs drawn once a year to confirm proper dosing. Pt verbalized understanding. CBC/BMET ordered and pt stated he would come tomorrow afternoon to have labs drawn. Also transferred pt to schedulers to make an appt to see provider as soon as possible. Pt has enough Eliquis to last until labs are resulted.

## 2022-03-12 LAB — CBC
Hematocrit: 47.3 % (ref 37.5–51.0)
Hemoglobin: 15.6 g/dL (ref 13.0–17.7)
MCH: 26.2 pg — ABNORMAL LOW (ref 26.6–33.0)
MCHC: 33 g/dL (ref 31.5–35.7)
MCV: 80 fL (ref 79–97)
Platelets: 195 10*3/uL (ref 150–450)
RBC: 5.95 x10E6/uL — ABNORMAL HIGH (ref 4.14–5.80)
RDW: 16.2 % — ABNORMAL HIGH (ref 11.6–15.4)
WBC: 6.7 10*3/uL (ref 3.4–10.8)

## 2022-03-13 LAB — BASIC METABOLIC PANEL
BUN/Creatinine Ratio: 9 — ABNORMAL LOW (ref 10–24)
BUN: 12 mg/dL (ref 8–27)
CO2: 19 mmol/L — ABNORMAL LOW (ref 20–29)
Calcium: 9.1 mg/dL (ref 8.6–10.2)
Chloride: 105 mmol/L (ref 96–106)
Creatinine, Ser: 1.27 mg/dL (ref 0.76–1.27)
Glucose: 84 mg/dL (ref 70–99)
Potassium: 4.5 mmol/L (ref 3.5–5.2)
Sodium: 139 mmol/L (ref 134–144)
eGFR: 60 mL/min/{1.73_m2} (ref >59)

## 2022-03-15 NOTE — Telephone Encounter (Signed)
Pt had labs drawn on 03/11/22. Scr was 1.27. Pt has scheduled appt with Dr Audie Box on 06/16/22. Appropriate dose and refill sent to last until upcoming appt.

## 2022-06-14 NOTE — Progress Notes (Unsigned)
Cardiology Office Note:   Date:  06/16/2022  NAME:  Ronald Davis    MRN: [id number] DOB:  05/08/1951   PCP:  Aletha Halim., PA-C  Cardiologist:  Evalina Field, MD  Electrophysiologist:  None   Referring MD: Aletha Halim., PA-C   Chief Complaint  Patient presents with   Follow-up    History of Present Illness:   Ronald Davis is a 71 y.o. male with a hx of persistent Afib s/p ablation, CAD, HLD who presents for follow-up.  He overall reports he is doing well.  He has had 1 A-fib episode in 12 months.  Lasted roughly 20 minutes.  Triggered by excess alcohol use.  Denies any chest pain or trouble breathing.  Walking 15 to 20 minutes in the mornings with his dogs.  No significant limitations.  No bleeding on Eliquis.  Has not had recent lipids checked.  He will get this done by his primary care physician.  His EKG shows sinus rhythm with no acute ischemic changes or evidence of infarction.  No symptoms concerning for angina.  BP is well controlled.  Problem List 1. Persistent Atrial Fibrillation  -TEE/DCCV 07/20/2019 -ablation 09/04/2019 -on eliquis  2. CAD  -CAC 946 (84th percentile) 09/01/2019 -negative NM MPI 2015 3. HLD -T chol 99, HDL 33, triglycerides 151, LDL 52  Past Medical History: Past Medical History:  Diagnosis Date   Allergy    Cataract    Coronary artery disease    Hyperlipidemia    Low BP    Paroxysmal atrial fibrillation (HCC)    Sleep apnea    no longer as issue per pt 04-15-21    Past Surgical History: Past Surgical History:  Procedure Laterality Date   ATRIAL FIBRILLATION ABLATION N/A 09/04/2019   Procedure: ATRIAL FIBRILLATION ABLATION;  Surgeon: Thompson Grayer, MD;  Location: [place] [facility];  Service: Cardiovascular;  Laterality: N/A;   CARDIOVERSION N/A 07/20/2019   Procedure: CARDIOVERSION;  Surgeon: Fay Records, MD;  Location: [facility];  Service: Cardiovascular;  Laterality: N/A;   COLONOSCOPY     ears     ears pinned back    INGUINAL HERNIA REPAIR     right   MOLE REMOVAL     POLYPECTOMY     RHINOPLASTY      for sleep apnea   TEE WITHOUT CARDIOVERSION N/A 07/20/2019   Procedure: TRANSESOPHAGEAL ECHOCARDIOGRAM (TEE);  Surgeon: Fay Records, MD;  Location: [facility];  Service: Cardiovascular;  Laterality: N/A;   tonisllectomy      Current Medications: Current Meds  Medication Sig   rosuvastatin (CRESTOR) 20 MG tablet TAKE 1 TABLET BY MOUTH EVERYDAY AT BEDTIME   tamsulosin (FLOMAX) 0.4 MG CAPS capsule Take 0.4 mg by mouth daily.    traZODone (DESYREL) 50 MG tablet TAKE 1 TABLET BY MOUTH EVERYDAY AT BEDTIME   [DISCONTINUED] apixaban (ELIQUIS) 5 MG TABS tablet TAKE 1 TABLET BY MOUTH TWICE A DAY     Allergies:    Naproxen, Amoxicillin, and Tomato   Social History: Social History   Socioeconomic History   Marital status: Married    Spouse name: Not on file   Number of children: 0   Years of education: Not on file   Highest education level: Not on file  Occupational History   Not on file  Tobacco Use   Smoking status: Never   Smokeless tobacco: Never  Vaping Use   Vaping Use: Never used  Substance and Sexual Activity  Alcohol use: Yes    Comment: 1 beer a day   Drug use: No   Sexual activity: Not on file  Other Topics Concern   Not on file  Social History Narrative   Forensic psychologist, retired    Investment banker, operational of Radio broadcast assistant Strain: Not on file  Food Insecurity: Not on file  Transportation Needs: Not on file  Physical Activity: Not on file  Stress: Not on file  Social Connections: Not on file     Family History: The patient's family history includes CAD in his father; Colon cancer in his maternal grandmother; Colon polyps in his brother, brother, sister, sister, sister, and sister; Prostate cancer in his father; Stroke in his father and mother. There is no history of Esophageal cancer, Rectal cancer, or Stomach cancer.  ROS:   All other ROS reviewed and  negative. Pertinent positives noted in the HPI.     EKGs/Labs/Other Studies Reviewed:   The following studies were personally reviewed by me today:  EKG:  EKG is ordered today.  The ekg ordered today demonstrates normal sinus rhythm heart rate 62, no acute ischemic changes or evidence of infarction, and was personally reviewed by me.   Recent Labs: 03/12/2022: BUN 12; Creatinine, Ser 1.27; Hemoglobin 15.6; Platelets 195; Potassium 4.5; Sodium 139   Recent Lipid Panel No results found for: "CHOL", "TRIG", "HDL", "CHOLHDL", "VLDL", "LDLCALC", "LDLDIRECT"  Physical Exam:   VS:  BP 124/60   Pulse 62   Ht '6\' 1"'$  (1.854 m)   Wt 241 lb 9.6 oz (109.6 kg)   SpO2 96%   BMI 31.88 kg/m    Wt Readings from Last 3 Encounters:  06/16/22 241 lb 9.6 oz (109.6 kg)  04/15/21 238 lb (108 kg)  04/06/21 238 lb (108 kg)    General: Well nourished, well developed, in no acute distress Head: Atraumatic, normal size  Eyes: PEERLA, EOMI  Neck: Supple, no JVD Endocrine: No thryomegaly Cardiac: Normal S1, S2; RRR; no murmurs, rubs, or gallops Lungs: Clear to auscultation bilaterally, no wheezing, rhonchi or rales  Abd: Soft, nontender, no hepatomegaly  Ext: No edema, pulses 2+ Musculoskeletal: No deformities, BUE and BLE strength normal and equal Skin: Warm and dry, no rashes   Neuro: Alert and oriented to person, place, time, and situation, CNII-XII grossly intact, no focal deficits  Psych: Normal mood and affect   ASSESSMENT:   Ronald Davis is a 70 y.o. male who presents for the following: 1. Persistent atrial fibrillation ([place])   2. Coronary artery disease involving native coronary artery of native heart without angina pectoris   3. Agatston coronary artery calcium score greater than 400   4. Mixed hyperlipidemia   5. Atrial fibrillation, unspecified type ([place])     PLAN:   1. Persistent atrial fibrillation ([place]) -Status post ablation.  Maintaining sinus rhythm.  Continue Eliquis 5 mg twice  daily.  Has only had 1 A-fib episode in 12 months.  Ablation seems to be working well.  2. Coronary artery disease involving native coronary artery of native heart without angina pectoris 3. Agatston coronary artery calcium score greater than 400 4. Mixed hyperlipidemia -Coronary calcium score.  No symptoms of angina.  Continue Crestor 20 mg daily.  Lipids were controlled at last visit.  Needs lipid values this year.  He will forward [place] the results from his primary care physician.  Disposition: Return in about 1 year (around 06/17/2023).  Medication Adjustments/Labs and Tests Ordered: Current medicines are  reviewed at length with the patient today.  Concerns regarding medicines are outlined above.  Orders Placed This Encounter  Procedures   EKG 12-Lead   Meds ordered this encounter  Medications   apixaban (ELIQUIS) 5 MG TABS tablet    Sig: Take 1 tablet (5 mg total) by mouth 2 (two) times daily.    Dispense:  60 tablet    Refill:  3    Patient Instructions  Medication Instructions:  The current medical regimen is effective;  continue present plan and medications.  *If you need a refill on your cardiac medications before your next appointment, please call your pharmacy*   Follow-Up: At [facility], you and your health needs are our priority.  As part of our continuing mission to provide you with exceptional heart care, we have created designated Provider Care Teams.  These Care Teams include your primary Cardiologist (physician) and Advanced Practice Providers (APPs -  Physician Assistants and Nurse Practitioners) who all work together to provide you with the care you need, when you need it.  We recommend signing up for the patient portal called "MyChart".  Sign up information is provided on this After Visit Summary.  MyChart is used to connect with patients for Virtual Visits (Telemedicine).  Patients are able to view lab/test results, encounter notes, upcoming appointments,  etc.  Non-urgent messages can be sent to your provider as well.   To learn more about what you can do with MyChart, go to [email or web address].    Your next appointment:   12 month(s)  The format for your next appointment:   In Person  Provider:   Evalina Field, MD           Time Spent with Patient: I have spent a total of 25 minutes with patient reviewing hospital notes, telemetry, EKGs, labs and examining the patient as well as establishing an assessment and plan that was discussed with the patient.  > 50% of time was spent in direct patient care.  Signed, Addison Naegeli. Audie Box, MD, [place]  [address] [place], [place] [address] [phone]  06/16/2022 8:38 AM

## 2022-06-15 ENCOUNTER — Other Ambulatory Visit: Payer: Self-pay | Admitting: Cardiovascular Disease

## 2022-06-15 DIAGNOSIS — I4891 Unspecified atrial fibrillation: Secondary | ICD-10-CM

## 2022-06-15 NOTE — Telephone Encounter (Signed)
Prescription refill request for Eliquis received. Indication: Last office visit: 12/15/20  Bearl Mulberry MD (Has appt 06/16/22) Scr: 1.27 on 03/12/22 Age: 70 Weight: 104.2kg  Based on above findings Eliquis '5mg'$  twice daily is the appropriate dose.  Refill approved.

## 2022-06-16 ENCOUNTER — Encounter: Payer: Self-pay | Admitting: Cardiovascular Disease

## 2022-06-16 ENCOUNTER — Ambulatory Visit: Payer: Medicare Other | Attending: Cardiovascular Disease | Admitting: Cardiovascular Disease

## 2022-06-16 VITALS — BP 124/60 | HR 62 | Ht 73.0 in | Wt 241.6 lb

## 2022-06-16 DIAGNOSIS — I2510 Atherosclerotic heart disease of native coronary artery without angina pectoris: Secondary | ICD-10-CM | POA: Diagnosis not present

## 2022-06-16 DIAGNOSIS — R931 Abnormal findings on diagnostic imaging of heart and coronary circulation: Secondary | ICD-10-CM | POA: Insufficient documentation

## 2022-06-16 DIAGNOSIS — E782 Mixed hyperlipidemia: Secondary | ICD-10-CM | POA: Insufficient documentation

## 2022-06-16 DIAGNOSIS — I4891 Unspecified atrial fibrillation: Secondary | ICD-10-CM | POA: Insufficient documentation

## 2022-06-16 DIAGNOSIS — I4819 Other persistent atrial fibrillation: Secondary | ICD-10-CM | POA: Insufficient documentation

## 2022-06-16 MED ORDER — APIXABAN 5 MG PO TABS: 5.0000 mg | ORAL_TABLET | Freq: Two times a day (BID) | ORAL | 3 refills | Status: DC

## 2022-06-16 NOTE — Patient Instructions (Signed)

## 2022-06-20 ENCOUNTER — Encounter: Payer: Self-pay | Admitting: Cardiovascular Disease

## 2022-06-21 ENCOUNTER — Encounter: Payer: Self-pay | Admitting: Cardiovascular Disease

## 2022-06-21 DIAGNOSIS — E782 Mixed hyperlipidemia: Secondary | ICD-10-CM

## 2022-07-08 ENCOUNTER — Other Ambulatory Visit: Payer: Self-pay | Admitting: Cardiovascular Disease

## 2022-07-08 DIAGNOSIS — R931 Abnormal findings on diagnostic imaging of heart and coronary circulation: Secondary | ICD-10-CM

## 2022-11-23 ENCOUNTER — Other Ambulatory Visit: Payer: Self-pay | Admitting: Cardiovascular Disease

## 2022-11-23 DIAGNOSIS — I4891 Unspecified atrial fibrillation: Secondary | ICD-10-CM

## 2022-11-23 NOTE — Telephone Encounter (Signed)
Pt last saw Dr Gerri Spore O'Neal 06/16/22, last labs 03/12/22 Creat 1.27, age 72, weight 109.6kg, based on specified criteria pt is on appropriate dosage of Eliquis 5mg  BID for afib.  Will refill rx.

## 2023-02-17 ENCOUNTER — Encounter (HOSPITAL_BASED_OUTPATIENT_CLINIC_OR_DEPARTMENT_OTHER): Payer: Self-pay | Admitting: *Deleted

## 2023-02-17 ENCOUNTER — Emergency Department (HOSPITAL_BASED_OUTPATIENT_CLINIC_OR_DEPARTMENT_OTHER)
Admission: EM | Admit: 2023-02-17 | Discharge: 2023-02-17 | Disposition: A | Discharge status: Home / Self Care | Payer: Medicare HMO | Attending: Emergency Medicine | Admitting: Emergency Medicine

## 2023-02-17 ENCOUNTER — Other Ambulatory Visit: Payer: Self-pay | Admitting: Cardiovascular Disease

## 2023-02-17 ENCOUNTER — Other Ambulatory Visit: Payer: Self-pay

## 2023-02-17 DIAGNOSIS — Z7901 Long term (current) use of anticoagulants: Secondary | ICD-10-CM | POA: Diagnosis not present

## 2023-02-17 DIAGNOSIS — I4891 Unspecified atrial fibrillation: Secondary | ICD-10-CM | POA: Insufficient documentation

## 2023-02-17 DIAGNOSIS — T63441A Toxic effect of venom of bees, accidental (unintentional), initial encounter: Secondary | ICD-10-CM | POA: Diagnosis present

## 2023-02-17 DIAGNOSIS — W57XXXA Bitten or stung by nonvenomous insect and other nonvenomous arthropods, initial encounter: Secondary | ICD-10-CM | POA: Diagnosis not present

## 2023-02-17 NOTE — ED Provider Notes (Signed)
[place] EMERGENCY DEPARTMENT AT [facility] Provider Note   CSN: [id number] Arrival date & time: 02/17/23  1903     History  Chief Complaint  Patient presents with   Insect Bite    Ronald Davis is a 72 y.o. male.  Patient stung with 5 yellow jackets at 1830.  Patient does not have a history of allergies to bees.  Did have some welts and redness.  No tongue swelling no lip swelling no difficulty breathing no other type rash as far as hives go.  Patient has a history of atrial fibrillation and is on Eliquis.  Followed by [facility] cardiology.  Patient is concerned that the bee stings could have an effect on his heart.  Patient denies any palpitations or rapid heart rate.  Patient denies any difficulty swallowing or any difficulty breathing.  Patient took Benadryl at home and extra strength Tylenol x 2 in the last hour.  Patient arrived here with temp of 97.7 oxygen saturation 96.  Heart rate was 98 respirations 20 blood pressure 130/78.  Cardiac monitor showed atrial fibrillation.  Past history significant for sleep apnea.  Also hyperlipidemia coronary artery disease atrial fibrillation.  Patient is never used tobacco products.  Patient last seen in the emergency department in 2015.       Home Medications Prior to Admission medications   Medication Sig Start Date End Date Taking? Authorizing Provider  apixaban (ELIQUIS) 5 MG TABS tablet TAKE 1 TABLET BY MOUTH TWICE A DAY 11/23/22   O'Neal, Ronnald Ramp, MD  rosuvastatin (CRESTOR) 20 MG tablet TAKE 1 TABLET BY MOUTH EVERYDAY AT BEDTIME 07/12/22   O'Neal, Ronnald Ramp, MD  tamsulosin (FLOMAX) 0.4 MG CAPS capsule Take 0.4 mg by mouth daily.     [provider]  traZODone (DESYREL) 50 MG tablet TAKE 1 TABLET BY MOUTH EVERYDAY AT BEDTIME 02/10/22   O'Neal, Ronnald Ramp, MD      Allergies    Naproxen, Amoxicillin, and Tomato    Review of Systems   Review of Systems  Constitutional:  Negative for chills and fever.  HENT:   Negative for ear pain and sore throat.   Eyes:  Negative for pain and visual disturbance.  Respiratory:  Negative for cough, shortness of breath and wheezing.   Cardiovascular:  Negative for chest pain and palpitations.  Gastrointestinal:  Negative for abdominal pain and vomiting.  Genitourinary:  Negative for dysuria and hematuria.  Musculoskeletal:  Negative for arthralgias and back pain.  Skin:  Negative for color change and rash.  Neurological:  Negative for seizures and syncope.  All other systems reviewed and are negative.   Physical Exam Updated Vital Signs BP 130/76 (BP Location: Left Arm)   Pulse 91   Temp 97.7 F (36.5 C)   Resp 20   Ht 1.854 m (6\' 1" )   Wt 108.9 kg   SpO2 98%   BMI 31.66 kg/m  Physical Exam Vitals and nursing note reviewed.  Constitutional:      General: He is not in acute distress.    Appearance: Normal appearance. He is well-developed.  HENT:     Head: Normocephalic and atraumatic.     Mouth/Throat:     Mouth: Mucous membranes are moist.     Pharynx: Oropharynx is clear.     Comments: No lip swelling no tongue swelling. Eyes:     Extraocular Movements: Extraocular movements intact.     Conjunctiva/sclera: Conjunctivae normal.     Pupils: Pupils are equal, round,  and reactive to light.  Cardiovascular:     Rate and Rhythm: Normal rate. Rhythm irregular.     Heart sounds: No murmur heard. Pulmonary:     Effort: Pulmonary effort is normal. No respiratory distress.     Breath sounds: Normal breath sounds. No stridor. No wheezing, rhonchi or rales.  Abdominal:     Palpations: Abdomen is soft.     Tenderness: There is no abdominal tenderness.  Musculoskeletal:        General: No swelling.     Cervical back: Normal range of motion and neck supple.  Skin:    General: Skin is warm and dry.     Capillary Refill: Capillary refill takes less than 2 seconds.     Findings: No rash.     Comments: No hives.  There is some erythema where the  yellowjacket stings occurred some on the arms some on the chest and upper neck area.  Neurological:     Mental Status: He is alert.  Psychiatric:        Mood and Affect: Mood normal.     ED Results / Procedures / Treatments   Labs (all labs ordered are listed, but only abnormal results are displayed) Labs Reviewed - No data to display  EKG EKG Interpretation Date/Time:  Thursday February 17 2023 19:25:39 EDT Ventricular Rate:  108 PR Interval:    QRS Duration:  86 QT Interval:  292 QTC Calculation: 391 R Axis:   63  Text Interpretation: Atrial fibrillation with rapid ventricular response Abnormal ECG When compared with ECG of 02-Oct-2019 11:34, Atrial fibrillation has replaced Sinus rhythm Vent. rate has increased BY  49 BPM Confirmed by Vanetta Mulders [phone]) on 02/17/2023 7:52:00 PM  Radiology No results found.  Procedures Procedures    Medications Ordered in ED Medications - No data to display  ED Course/ Medical Decision Making/ A&P                             Medical Decision Making  Cardiac monitoring EKG is consistent with atrial fibrillation rate really right around 100.  Not significant tachycardia.  Patient did not realize that his rate was irregular.  He is on his Eliquis.  Patient feels that he does not have atrial fibrillation often but he Rande Lawman correlates it to really when the heart rate is fast.  So he clearly cannot tell that is irregular currently.  But everything is fine with that.  Lungs are clear no significant reaction from the yellowjacket stings.  Patient continue Benadryl at home and also recommended extra strength Tylenol for any discomfort.  Returning for any new or worse symptoms.   Final Clinical Impression(s) / ED Diagnoses Final diagnoses:  Bee sting, accidental or unintentional, initial encounter    Rx / DC Orders ED Discharge Orders     None         Vanetta Mulders, MD 02/17/23 2346

## 2023-02-17 NOTE — ED Triage Notes (Addendum)
Pt c/o multiple bee stings prior to arrival. reports hx of afib, is on eliquis. Denies cp, sob. Concerned that for effects bee stings could have on his heart. Bee stings noted to neck, bilateral arms, abd. Has taken benadryls x 2 and extra strength tylenol x2 within the last hour

## 2023-02-17 NOTE — Discharge Instructions (Signed)
As we discussed could take Benadryl for the next 24 hours.  Would recommend taking extra strength Tylenol 2 tablets every 8 hours for any discomfort.  Return for any signs of allergic reaction which is unlikely at this point in time.

## 2023-06-08 ENCOUNTER — Other Ambulatory Visit: Payer: Self-pay | Admitting: Cardiovascular Disease

## 2023-06-08 DIAGNOSIS — I4891 Unspecified atrial fibrillation: Secondary | ICD-10-CM

## 2023-06-08 NOTE — Telephone Encounter (Signed)
Prescription refill request for Eliquis received. Indication: Afib  Last office visit: 06/16/22 (O'Neal)  Scr: 1.51 (02/07/23)  Age: 72 Weight: 108.9kg  Appropriate dose. Refill sent.

## 2023-07-16 ENCOUNTER — Other Ambulatory Visit: Payer: Self-pay | Admitting: Cardiovascular Disease

## 2023-07-16 DIAGNOSIS — R931 Abnormal findings on diagnostic imaging of heart and coronary circulation: Secondary | ICD-10-CM

## 2023-07-28 NOTE — H&P (View-Only) (Signed)
 Cardiology Office Note    Date:  07/29/2023  ID:  EULES CRUMBLISS, DOB 1951/06/07, MRN [id number] PCP:  Richmond Campbell., PA-C  Cardiologist:  [name], MD  Electrophysiologist:  None   Chief Complaint: Fatigue   History of Present Illness: .    YIGIT DELGRANDE is a 72 y.o. male with visit-pertinent history of persistent A-fib s/p ablation, CAD, hyperlipidemia.  Patient had MPI in 2015 that was normal with no evidence of ischemia or nor infarction.  In 2021 he had a coronary calcium score of 946 was with 84th percentile for age and sex matched controls.  In 08/2019 he underwent A-fib ablation.  He was last seen in clinic by Dr. Flora Lipps on 06/16/2022.  He remained stable from a cardiac perspective.  He reported only 1 episode of atrial fibrillation in the prior 12 months that lasted roughly 20 minutes.  That had been triggered by excessive alcohol use.  Today he presents for follow up and with concerns for increased fatigue. His wife notes in recent months he seems more sluggish and unlike himself. He denies chest pain, shortness of breath, lower extremity edema or palpitations. He notes when he went to the emergency room in July for bee stings he was in atrial fibrillation. EKG today indicates that he is in atrial fibrillation with RVR at 115 bpm.  He is overall cardiac unaware, denies palpitations or feeling of increased heart rate, he has only noted increased fatigue in recent months.  Labwork independently reviewed: 03/09/2023: Sodium 137, potassium 4.3, creatinine 1.32  ROS: .   Today he denies chest pain, shortness of breath, lower extremity edema, palpitations, melena, hematuria, hemoptysis, diaphoresis, weakness, presyncope, syncope, orthopnea, and PND.  All other systems are reviewed and otherwise negative. Studies Reviewed: Marland Kitchen    EKG:  EKG is ordered today, personally reviewed, demonstrating  EKG Interpretation Date/Time:  Friday July 29 2023 15:07:44 EST Ventricular  Rate:  115 PR Interval:    QRS Duration:  88 QT Interval:  330 QTC Calculation: 456 R Axis:   53  Text Interpretation: Atrial fibrillation with rapid ventricular response When compared with ECG of 17-Feb-2023 19:25, No significant change was found Confirmed by Reather Littler [phone]) on 07/29/2023 4:37:01 PM   CV Studies:  Cardiac Studies & Procedures      ECHOCARDIOGRAM  ECHOCARDIOGRAM COMPLETE 07/24/2019  Narrative ECHOCARDIOGRAM REPORT    Patient Name:   EDWYN YEATES Date of Exam: 07/24/2019 Medical Rec #:  [id number]     Height:       73.0 in Accession #:    [id number]    Weight:       249.4 lb Date of Birth:  Jun 24, 1951    BSA:          2.36 m Patient Age:    68 years      BP:           135/74 mmHg Patient Gender: M             HR:           80 bpm. Exam Location:  [address]  Procedure: 2D Echo, Cardiac Doppler and Color Doppler  Indications:    I48.19  History:        Patient has no prior history of Echocardiogram examinations. CAD, Mitral Valve Prolapse, Arrythmias:Atrial Fibrillation; Risk Factors:Dyslipidemia.  Sonographer:    Samule Ohm RDCS Referring Phys: [id number] Ronnald Ramp O'NEAL  IMPRESSIONS   1. Left ventricular ejection fraction,  by visual estimation, is 60 to 65%. The left ventricle has normal function. There is no left ventricular hypertrophy. 2. Left ventricular diastolic function could not be evaluated. 3. Global right ventricle has normal systolic function.The right ventricular size is normal. No increase in right ventricular wall thickness. 4. Left atrial size was normal. 5. Right atrial size was normal. 6. The mitral valve is normal in structure. Mild mitral valve regurgitation. No evidence of mitral stenosis. 7. The tricuspid valve is normal in structure. Tricuspid valve regurgitation is mild. 8. The aortic valve is normal in structure. Aortic valve regurgitation is not visualized. Mild to moderate aortic valve sclerosis/calcification  without any evidence of aortic stenosis. 9. The pulmonic valve was normal in structure. Pulmonic valve regurgitation is not visualized. 10. Normal pulmonary artery systolic pressure. 11. The inferior vena cava is normal in size with greater than 50% respiratory variability, suggesting right atrial pressure of 3 mmHg.  FINDINGS Left Ventricle: Left ventricular ejection fraction, by visual estimation, is 60 to 65%. The left ventricle has normal function. There is no left ventricular hypertrophy. The left ventricular diastology could not be evaluated due to atrial fibrillation. Left ventricular diastolic function could not be evaluated. Normal left atrial pressure.  Right Ventricle: The right ventricular size is normal. No increase in right ventricular wall thickness. Global RV systolic function is has normal systolic function. The tricuspid regurgitant velocity is 1.90 m/s, and with an assumed right atrial pressure of 3 mmHg, the estimated right ventricular systolic pressure is normal at 17.4 mmHg.  Left Atrium: Left atrial size was normal in size.  Right Atrium: Right atrial size was normal in size  Pericardium: There is no evidence of pericardial effusion.  Mitral Valve: The mitral valve is normal in structure. There is mild thickening of the mitral valve leaflet(s). No evidence of mitral valve stenosis by observation. Mild mitral valve regurgitation.  Tricuspid Valve: The tricuspid valve is normal in structure. Tricuspid valve regurgitation is mild.  Aortic Valve: The aortic valve is normal in structure.. There is mild thickening and mild calcification of the aortic valve. Aortic valve regurgitation is not visualized. Mild to moderate aortic valve sclerosis/calcification is present, without any evidence of aortic stenosis. There is mild thickening of the aortic valve. There is mild calcification of the aortic valve.  Pulmonic Valve: The pulmonic valve was normal in structure. Pulmonic valve  regurgitation is not visualized.  Aorta: The aortic root, ascending aorta and aortic arch are all structurally normal, with no evidence of dilitation or obstruction.  Venous: The inferior vena cava is normal in size with greater than 50% respiratory variability, suggesting right atrial pressure of 3 mmHg.  IAS/Shunts: No atrial level shunt detected by color flow Doppler. There is no evidence of a patent foramen ovale. No ventricular septal defect is seen or detected. There is no evidence of an atrial septal defect.   LEFT VENTRICLE PLAX 2D LVIDd:         4.10 cm LVIDs:         2.50 cm LV PW:         1.40 cm LV IVS:        1.60 cm LVOT diam:     2.30 cm LV SV:         52 ml LV SV Index:   21.22 LVOT Area:     4.15 cm   RIGHT VENTRICLE            IVC RVSP:  17.4 mmHg  IVC diam: 1.10 cm  LEFT ATRIUM           Index       RIGHT ATRIUM           Index LA diam:      3.30 cm 1.40 cm/m  RA Pressure: 3.00 mmHg LA Vol (A2C): 52.2 ml 22.09 ml/m RA Area:     20.40 cm LA Vol (A4C): 38.1 ml 16.12 ml/m RA Volume:   60.70 ml  25.68 ml/m AORTIC VALVE LVOT Vmax:   86.25 cm/s LVOT Vmean:  55.925 cm/s LVOT VTI:    0.132 m  AORTA Ao Root diam: 3.50 cm Ao Asc diam:  3.90 cm  MITRAL VALVE                       TRICUSPID VALVE MV Area (PHT):                     TR Peak grad:   14.4 mmHg TR Vmax:        206.00 cm/s MV Decel Time: 250 msec            Estimated RAP:  3.00 mmHg MV E velocity: 89.36 cm/s 103 cm/s RVSP:           17.4 mmHg  SHUNTS Systemic VTI:  0.13 m Systemic Diam: 2.30 cm   Tobias Alexander MD Electronically signed by Tobias Alexander MD Signature Date/Time: 07/24/2019/7:19:34 PM    Final  TEE  ECHO TEE 07/20/2019  Narrative TRANSESOPHOGEAL ECHO REPORT    Patient Name:   EUCLIDES MIFSUD Date of Exam: 07/20/2019 Medical Rec #:  [id number]     Height:       73.0 in Accession #:    [id number]    Weight:       249.4 lb Date of Birth:  12-18-1950    BSA:           2.36 m Patient Age:    68 years      BP:           108/74 mmHg Patient Gender: M             HR:           126 bpm. Exam Location:  Inpatient   Procedure: Transesophageal Echo  Indications:     Atrial fibrillation  History:         Patient has no prior history of Echocardiogram examinations.  Sonographer:     Leeroy Bock Turrentine Referring Phys:  [id number] Ronnald Ramp O'NEAL Diagnosing Phys: Dietrich Pates MD    PROCEDURE: The transesophogeal probe was passed through the esophogus of the patient. The patient developed no complications during the procedure.  IMPRESSIONS   1. Left ventricular ejection fraction, by visual estimation, is 60 to 65%. The left ventricle has normal function. Normal left ventricular size. There is no left ventricular hypertrophy. 2. Global right ventricle has normal systolic function.The right ventricular size is normal. No increase in right ventricular wall thickness. 3. Left atrial size was moderately dilated. 4. LA, LAA without masses. 5. Right atrial size was normal. 6. The mitral valve is abnormal. Moderate to severe mitral valve regurgitation. 7. MV has very mild prolapse of the posterior mitral leaflet. MR is moderate to severe with central and anteriorly directed jets. 8. The tricuspid valve is normal in structure. Tricuspid valve regurgitation is trivial. 9. The aortic valve is grossly normal. Aortic valve regurgitation is  not visualized. 10. The pulmonic valve was normal in structure. Pulmonic valve regurgitation is trivial. 11. Fixed plaquing in aortic arch and carotid artery. 12. No shunts as tested by color doppler.  FINDINGS Left Ventricle: Left ventricular ejection fraction, by visual estimation, is 60 to 65%. The left ventricle has normal function. There is no left ventricular hypertrophy. Normal left ventricular size.  Right Ventricle: The right ventricular size is normal. No increase in right ventricular wall thickness. Global RV  systolic function is has normal systolic function.  Left Atrium: Left atrial size was moderately dilated. LA, LAA without masses.  Right Atrium: Right atrial size was normal in size  Pericardium: There is no evidence of pericardial effusion.  Mitral Valve: The mitral valve is abnormal. Moderate to severe mitral valve regurgitation. MV has very mild prolapse of the posterior mitral leaflet. MR is moderate to severe with central and anteriorly directed jets.  Tricuspid Valve: The tricuspid valve is normal in structure. Tricuspid valve regurgitation is trivial.  Aortic Valve: The aortic valve is grossly normal. Aortic valve regurgitation is not visualized.  Pulmonic Valve: The pulmonic valve was normal in structure. Pulmonic valve regurgitation is trivial.  Aorta: The aortic root is normal in size and structure. Fixed plaquing in aortic arch and carotid artery.  Shunts: No shunts as tested by color doppler.   Dietrich Pates MD Electronically signed by Dietrich Pates MD Signature Date/Time: 07/20/2019/9:43:50 PM    Final   CT SCANS  CT CARDIAC SCORING (SELF PAY ONLY) 01/03/2014  Narrative EXAM: OVER-READ INTERPRETATION  CT CHEST  The following report is an over-read performed by radiologist Dr. Eliezer Mccoy [facility] on 01/03/2014. This over-read does not include interpretation of cardiac or coronary anatomy or pathology. The coronary calcium score interpretation by the cardiologist is attached.  COMPARISON:  Chest radiographs dated 12/02/2013  FINDINGS: Evaluation of lung parenchyma is constrained by respiratory motion.  11 x 8 mm nodule along the medial/inferior aspect of the right upper lobe, adjacent to the minor fissure (series 4/ image 15), poorly visualized but notable for calcification (series 5/image 15), favored to reflect a partially calcified granuloma.  Additional 4 mm nodule at the left lung base (series 4/ image 32). Adjacent nodular scarring  in the left lower lobe.  No suspicious thoracic lymphadenopathy in the visualized chest.  Degenerative changes of the visualized thoracic spine.  IMPRESSION: 11 mm partially calcified granuloma along the inferior aspect of the right upper lobe.  Additional 4 mm noncalcified nodule at the left lung base, indeterminate but statistically likely benign.  If this patient is high risk for primary bronchogenic neoplasm, a single follow-up CT chest is suggested in 12 months. If low risk, no dedicated follow-up imaging is required.  This recommendation follows the consensus statement: Guidelines for Management of Small Pulmonary Nodules Detected on CT Scans: A Statement from the Fleischner Society as published in Radiology 2005; 237:395-400.   Electronically Signed By: Charline Bills M.D. On: 01/03/2014 10:54           Current Reported Medications:.    Current Meds  Medication Sig   ELIQUIS 5 MG TABS tablet TAKE 1 TABLET BY MOUTH 2 TIMES A DAY   metoprolol tartrate (LOPRESSOR) 25 MG tablet Take 1 tablet (25 mg total) by mouth 2 (two) times daily.   rosuvastatin (CRESTOR) 20 MG tablet TAKE 1 TABLET BY MOUTH EVERYDAY AT BEDTIME   tamsulosin (FLOMAX) 0.4 MG CAPS capsule Take 0.4 mg by mouth daily.  traZODone (DESYREL) 50 MG tablet TAKE 1 TABLET BY MOUTH EVERYDAY AT BEDTIME    Physical Exam:    VS:  BP 110/62   Pulse (!) 115   Ht 6\' 1"  (1.854 m)   Wt 242 lb (109.8 kg)   SpO2 97%   BMI 31.93 kg/m    Wt Readings from Last 3 Encounters:  07/29/23 242 lb (109.8 kg)  02/17/23 240 lb (108.9 kg)  06/16/22 241 lb 9.6 oz (109.6 kg)    GEN: Well nourished, well developed in no acute distress NECK: No JVD; No carotid bruits CARDIAC: Irregular RR, no murmurs, rubs, gallops RESPIRATORY:  Clear to auscultation without rales, wheezing or rhonchi  ABDOMEN: Soft, non-tender, non-distended EXTREMITIES:  No edema; No acute deformity   Asessement and Plan:.    Persistent atrial  fibrillation: Patient underwent TEE/DCCV in 07/2019, he underwent A-fib ablation on 09/04/2019. EKG today indicates atrial fibrillation with RVR at 115 bpm.  Patient is overall cardiac unaware aside from increased fatigue.  He denies any palpitations or increased feelings of heart rate.  Discussed with Dr. Flora Lipps, will start patient on metoprolol tartrate 25 mg twice daily and set up for cardioversion.  Encouraged to monitor heart rate and blood pressure at home.  Reviewed ED precautions. Patient is agreeable to cardioversion, please see the consent below.  Patient does note that he missed 1 dose of Eliquis 2 weeks ago, discussed that he needs to continue Eliquis uninterrupted for at least 3 weeks prior to cardioversion. CHA2DS2-VASc Score = 2 [CHF History: 0, HTN History: 0, Diabetes History: 0, Stroke History: 0, Vascular Disease History: 1, Age Score: 1, Gender Score: 0].  Therefore, the patient's annual risk of stroke is 2.2 %. He denies any bleeding problems with Eliquis. Continue Eliquis 5 mg twice daily.  Start metoprolol tartrate 25 mg twice daily. Check CBC, BMET, Mag and TSH.  Informed Consent   Shared Decision Making/Informed Consent The risks (stroke, cardiac arrhythmias rarely resulting in the need for a temporary or permanent pacemaker, skin irritation or burns and complications associated with conscious sedation including aspiration, arrhythmia, respiratory failure and death), benefits (restoration of normal sinus rhythm) and alternatives of a direct current cardioversion were explained in detail to Mr. Cazes and he agrees to proceed.       CAD: MPI in 2015 that was normal with no evidence of ischemia or infarction.  Coronary calcium score of 946 which was 84th percentile for age and sex matched controls in 08/2019. Today he denies chest pain or shortness of breath.  Blood pressure well-controlled without antihypertensive medications.  On Eliquis 5 mg twice daily and rosuvastatin 20 mg  daily.  Hyperlipidemia: Last lipid profile on 02/07/2023 indicated total cholesterol 116, triglycerides 181, HDL 39 and LDL 51.  Continue rosuvastatin 20 mg daily.  Sleep apnea: Patient reports increased snoring in the last few months.  His wife notes that he seems to hold his breath or gasp at night.  He has not worn a CPAP in many years.  He is agreeable to a split-night sleep study.  STOP-BANG 6.     Disposition: F/u with Afib clinic two weeks after cardioversion, F/u Reather Littler, NP in 6-8 weeks.   Signed, Rip Harbour, NP

## 2023-07-28 NOTE — Progress Notes (Signed)
Cardiology Office Note    Date:  07/29/2023  ID:  EULES CRUMBLISS, DOB 1951/06/07, MRN [id number] PCP:  Richmond Campbell., PA-C  Cardiologist:  [name], MD  Electrophysiologist:  None   Chief Complaint: Fatigue   History of Present Illness: .    YIGIT DELGRANDE is a 72 y.o. male with visit-pertinent history of persistent A-fib s/p ablation, CAD, hyperlipidemia.  Patient had MPI in 2015 that was normal with no evidence of ischemia or nor infarction.  In 2021 he had a coronary calcium score of 946 was with 84th percentile for age and sex matched controls.  In 08/2019 he underwent A-fib ablation.  He was last seen in clinic by Dr. Flora Lipps on 06/16/2022.  He remained stable from a cardiac perspective.  He reported only 1 episode of atrial fibrillation in the prior 12 months that lasted roughly 20 minutes.  That had been triggered by excessive alcohol use.  Today he presents for follow up and with concerns for increased fatigue. His wife notes in recent months he seems more sluggish and unlike himself. He denies chest pain, shortness of breath, lower extremity edema or palpitations. He notes when he went to the emergency room in July for bee stings he was in atrial fibrillation. EKG today indicates that he is in atrial fibrillation with RVR at 115 bpm.  He is overall cardiac unaware, denies palpitations or feeling of increased heart rate, he has only noted increased fatigue in recent months.  Labwork independently reviewed: 03/09/2023: Sodium 137, potassium 4.3, creatinine 1.32  ROS: .   Today he denies chest pain, shortness of breath, lower extremity edema, palpitations, melena, hematuria, hemoptysis, diaphoresis, weakness, presyncope, syncope, orthopnea, and PND.  All other systems are reviewed and otherwise negative. Studies Reviewed: Marland Kitchen    EKG:  EKG is ordered today, personally reviewed, demonstrating  EKG Interpretation Date/Time:  Friday July 29 2023 15:07:44 EST Ventricular  Rate:  115 PR Interval:    QRS Duration:  88 QT Interval:  330 QTC Calculation: 456 R Axis:   53  Text Interpretation: Atrial fibrillation with rapid ventricular response When compared with ECG of 17-Feb-2023 19:25, No significant change was found Confirmed by Reather Littler [phone]) on 07/29/2023 4:37:01 PM   CV Studies:  Cardiac Studies & Procedures      ECHOCARDIOGRAM  ECHOCARDIOGRAM COMPLETE 07/24/2019  Narrative ECHOCARDIOGRAM REPORT    Patient Name:   EDWYN YEATES Date of Exam: 07/24/2019 Medical Rec #:  [id number]     Height:       73.0 in Accession #:    [id number]    Weight:       249.4 lb Date of Birth:  Jun 24, 1951    BSA:          2.36 m Patient Age:    68 years      BP:           135/74 mmHg Patient Gender: M             HR:           80 bpm. Exam Location:  [address]  Procedure: 2D Echo, Cardiac Doppler and Color Doppler  Indications:    I48.19  History:        Patient has no prior history of Echocardiogram examinations. CAD, Mitral Valve Prolapse, Arrythmias:Atrial Fibrillation; Risk Factors:Dyslipidemia.  Sonographer:    Samule Ohm RDCS Referring Phys: [id number] Ronnald Ramp O'NEAL  IMPRESSIONS   1. Left ventricular ejection fraction,  by visual estimation, is 60 to 65%. The left ventricle has normal function. There is no left ventricular hypertrophy. 2. Left ventricular diastolic function could not be evaluated. 3. Global right ventricle has normal systolic function.The right ventricular size is normal. No increase in right ventricular wall thickness. 4. Left atrial size was normal. 5. Right atrial size was normal. 6. The mitral valve is normal in structure. Mild mitral valve regurgitation. No evidence of mitral stenosis. 7. The tricuspid valve is normal in structure. Tricuspid valve regurgitation is mild. 8. The aortic valve is normal in structure. Aortic valve regurgitation is not visualized. Mild to moderate aortic valve sclerosis/calcification  without any evidence of aortic stenosis. 9. The pulmonic valve was normal in structure. Pulmonic valve regurgitation is not visualized. 10. Normal pulmonary artery systolic pressure. 11. The inferior vena cava is normal in size with greater than 50% respiratory variability, suggesting right atrial pressure of 3 mmHg.  FINDINGS Left Ventricle: Left ventricular ejection fraction, by visual estimation, is 60 to 65%. The left ventricle has normal function. There is no left ventricular hypertrophy. The left ventricular diastology could not be evaluated due to atrial fibrillation. Left ventricular diastolic function could not be evaluated. Normal left atrial pressure.  Right Ventricle: The right ventricular size is normal. No increase in right ventricular wall thickness. Global RV systolic function is has normal systolic function. The tricuspid regurgitant velocity is 1.90 m/s, and with an assumed right atrial pressure of 3 mmHg, the estimated right ventricular systolic pressure is normal at 17.4 mmHg.  Left Atrium: Left atrial size was normal in size.  Right Atrium: Right atrial size was normal in size  Pericardium: There is no evidence of pericardial effusion.  Mitral Valve: The mitral valve is normal in structure. There is mild thickening of the mitral valve leaflet(s). No evidence of mitral valve stenosis by observation. Mild mitral valve regurgitation.  Tricuspid Valve: The tricuspid valve is normal in structure. Tricuspid valve regurgitation is mild.  Aortic Valve: The aortic valve is normal in structure.. There is mild thickening and mild calcification of the aortic valve. Aortic valve regurgitation is not visualized. Mild to moderate aortic valve sclerosis/calcification is present, without any evidence of aortic stenosis. There is mild thickening of the aortic valve. There is mild calcification of the aortic valve.  Pulmonic Valve: The pulmonic valve was normal in structure. Pulmonic valve  regurgitation is not visualized.  Aorta: The aortic root, ascending aorta and aortic arch are all structurally normal, with no evidence of dilitation or obstruction.  Venous: The inferior vena cava is normal in size with greater than 50% respiratory variability, suggesting right atrial pressure of 3 mmHg.  IAS/Shunts: No atrial level shunt detected by color flow Doppler. There is no evidence of a patent foramen ovale. No ventricular septal defect is seen or detected. There is no evidence of an atrial septal defect.   LEFT VENTRICLE PLAX 2D LVIDd:         4.10 cm LVIDs:         2.50 cm LV PW:         1.40 cm LV IVS:        1.60 cm LVOT diam:     2.30 cm LV SV:         52 ml LV SV Index:   21.22 LVOT Area:     4.15 cm   RIGHT VENTRICLE            IVC RVSP:  17.4 mmHg  IVC diam: 1.10 cm  LEFT ATRIUM           Index       RIGHT ATRIUM           Index LA diam:      3.30 cm 1.40 cm/m  RA Pressure: 3.00 mmHg LA Vol (A2C): 52.2 ml 22.09 ml/m RA Area:     20.40 cm LA Vol (A4C): 38.1 ml 16.12 ml/m RA Volume:   60.70 ml  25.68 ml/m AORTIC VALVE LVOT Vmax:   86.25 cm/s LVOT Vmean:  55.925 cm/s LVOT VTI:    0.132 m  AORTA Ao Root diam: 3.50 cm Ao Asc diam:  3.90 cm  MITRAL VALVE                       TRICUSPID VALVE MV Area (PHT):                     TR Peak grad:   14.4 mmHg TR Vmax:        206.00 cm/s MV Decel Time: 250 msec            Estimated RAP:  3.00 mmHg MV E velocity: 89.36 cm/s 103 cm/s RVSP:           17.4 mmHg  SHUNTS Systemic VTI:  0.13 m Systemic Diam: 2.30 cm   Tobias Alexander MD Electronically signed by Tobias Alexander MD Signature Date/Time: 07/24/2019/7:19:34 PM    Final  TEE  ECHO TEE 07/20/2019  Narrative TRANSESOPHOGEAL ECHO REPORT    Patient Name:   EUCLIDES MIFSUD Date of Exam: 07/20/2019 Medical Rec #:  [id number]     Height:       73.0 in Accession #:    [id number]    Weight:       249.4 lb Date of Birth:  12-18-1950    BSA:           2.36 m Patient Age:    68 years      BP:           108/74 mmHg Patient Gender: M             HR:           126 bpm. Exam Location:  Inpatient   Procedure: Transesophageal Echo  Indications:     Atrial fibrillation  History:         Patient has no prior history of Echocardiogram examinations.  Sonographer:     Leeroy Bock Turrentine Referring Phys:  [id number] Ronnald Ramp O'NEAL Diagnosing Phys: Dietrich Pates MD    PROCEDURE: The transesophogeal probe was passed through the esophogus of the patient. The patient developed no complications during the procedure.  IMPRESSIONS   1. Left ventricular ejection fraction, by visual estimation, is 60 to 65%. The left ventricle has normal function. Normal left ventricular size. There is no left ventricular hypertrophy. 2. Global right ventricle has normal systolic function.The right ventricular size is normal. No increase in right ventricular wall thickness. 3. Left atrial size was moderately dilated. 4. LA, LAA without masses. 5. Right atrial size was normal. 6. The mitral valve is abnormal. Moderate to severe mitral valve regurgitation. 7. MV has very mild prolapse of the posterior mitral leaflet. MR is moderate to severe with central and anteriorly directed jets. 8. The tricuspid valve is normal in structure. Tricuspid valve regurgitation is trivial. 9. The aortic valve is grossly normal. Aortic valve regurgitation is  not visualized. 10. The pulmonic valve was normal in structure. Pulmonic valve regurgitation is trivial. 11. Fixed plaquing in aortic arch and carotid artery. 12. No shunts as tested by color doppler.  FINDINGS Left Ventricle: Left ventricular ejection fraction, by visual estimation, is 60 to 65%. The left ventricle has normal function. There is no left ventricular hypertrophy. Normal left ventricular size.  Right Ventricle: The right ventricular size is normal. No increase in right ventricular wall thickness. Global RV  systolic function is has normal systolic function.  Left Atrium: Left atrial size was moderately dilated. LA, LAA without masses.  Right Atrium: Right atrial size was normal in size  Pericardium: There is no evidence of pericardial effusion.  Mitral Valve: The mitral valve is abnormal. Moderate to severe mitral valve regurgitation. MV has very mild prolapse of the posterior mitral leaflet. MR is moderate to severe with central and anteriorly directed jets.  Tricuspid Valve: The tricuspid valve is normal in structure. Tricuspid valve regurgitation is trivial.  Aortic Valve: The aortic valve is grossly normal. Aortic valve regurgitation is not visualized.  Pulmonic Valve: The pulmonic valve was normal in structure. Pulmonic valve regurgitation is trivial.  Aorta: The aortic root is normal in size and structure. Fixed plaquing in aortic arch and carotid artery.  Shunts: No shunts as tested by color doppler.   Dietrich Pates MD Electronically signed by Dietrich Pates MD Signature Date/Time: 07/20/2019/9:43:50 PM    Final   CT SCANS  CT CARDIAC SCORING (SELF PAY ONLY) 01/03/2014  Narrative EXAM: OVER-READ INTERPRETATION  CT CHEST  The following report is an over-read performed by radiologist Dr. Eliezer Mccoy [facility] on 01/03/2014. This over-read does not include interpretation of cardiac or coronary anatomy or pathology. The coronary calcium score interpretation by the cardiologist is attached.  COMPARISON:  Chest radiographs dated 12/02/2013  FINDINGS: Evaluation of lung parenchyma is constrained by respiratory motion.  11 x 8 mm nodule along the medial/inferior aspect of the right upper lobe, adjacent to the minor fissure (series 4/ image 15), poorly visualized but notable for calcification (series 5/image 15), favored to reflect a partially calcified granuloma.  Additional 4 mm nodule at the left lung base (series 4/ image 32). Adjacent nodular scarring  in the left lower lobe.  No suspicious thoracic lymphadenopathy in the visualized chest.  Degenerative changes of the visualized thoracic spine.  IMPRESSION: 11 mm partially calcified granuloma along the inferior aspect of the right upper lobe.  Additional 4 mm noncalcified nodule at the left lung base, indeterminate but statistically likely benign.  If this patient is high risk for primary bronchogenic neoplasm, a single follow-up CT chest is suggested in 12 months. If low risk, no dedicated follow-up imaging is required.  This recommendation follows the consensus statement: Guidelines for Management of Small Pulmonary Nodules Detected on CT Scans: A Statement from the Fleischner Society as published in Radiology 2005; 237:395-400.   Electronically Signed By: Charline Bills M.D. On: 01/03/2014 10:54           Current Reported Medications:.    Current Meds  Medication Sig   ELIQUIS 5 MG TABS tablet TAKE 1 TABLET BY MOUTH 2 TIMES A DAY   metoprolol tartrate (LOPRESSOR) 25 MG tablet Take 1 tablet (25 mg total) by mouth 2 (two) times daily.   rosuvastatin (CRESTOR) 20 MG tablet TAKE 1 TABLET BY MOUTH EVERYDAY AT BEDTIME   tamsulosin (FLOMAX) 0.4 MG CAPS capsule Take 0.4 mg by mouth daily.  traZODone (DESYREL) 50 MG tablet TAKE 1 TABLET BY MOUTH EVERYDAY AT BEDTIME    Physical Exam:    VS:  BP 110/62   Pulse (!) 115   Ht 6\' 1"  (1.854 m)   Wt 242 lb (109.8 kg)   SpO2 97%   BMI 31.93 kg/m    Wt Readings from Last 3 Encounters:  07/29/23 242 lb (109.8 kg)  02/17/23 240 lb (108.9 kg)  06/16/22 241 lb 9.6 oz (109.6 kg)    GEN: Well nourished, well developed in no acute distress NECK: No JVD; No carotid bruits CARDIAC: Irregular RR, no murmurs, rubs, gallops RESPIRATORY:  Clear to auscultation without rales, wheezing or rhonchi  ABDOMEN: Soft, non-tender, non-distended EXTREMITIES:  No edema; No acute deformity   Asessement and Plan:.    Persistent atrial  fibrillation: Patient underwent TEE/DCCV in 07/2019, he underwent A-fib ablation on 09/04/2019. EKG today indicates atrial fibrillation with RVR at 115 bpm.  Patient is overall cardiac unaware aside from increased fatigue.  He denies any palpitations or increased feelings of heart rate.  Discussed with Dr. Flora Lipps, will start patient on metoprolol tartrate 25 mg twice daily and set up for cardioversion.  Encouraged to monitor heart rate and blood pressure at home.  Reviewed ED precautions. Patient is agreeable to cardioversion, please see the consent below.  Patient does note that he missed 1 dose of Eliquis 2 weeks ago, discussed that he needs to continue Eliquis uninterrupted for at least 3 weeks prior to cardioversion. CHA2DS2-VASc Score = 2 [CHF History: 0, HTN History: 0, Diabetes History: 0, Stroke History: 0, Vascular Disease History: 1, Age Score: 1, Gender Score: 0].  Therefore, the patient's annual risk of stroke is 2.2 %. He denies any bleeding problems with Eliquis. Continue Eliquis 5 mg twice daily.  Start metoprolol tartrate 25 mg twice daily. Check CBC, BMET, Mag and TSH.  Informed Consent   Shared Decision Making/Informed Consent The risks (stroke, cardiac arrhythmias rarely resulting in the need for a temporary or permanent pacemaker, skin irritation or burns and complications associated with conscious sedation including aspiration, arrhythmia, respiratory failure and death), benefits (restoration of normal sinus rhythm) and alternatives of a direct current cardioversion were explained in detail to Mr. Cazes and he agrees to proceed.       CAD: MPI in 2015 that was normal with no evidence of ischemia or infarction.  Coronary calcium score of 946 which was 84th percentile for age and sex matched controls in 08/2019. Today he denies chest pain or shortness of breath.  Blood pressure well-controlled without antihypertensive medications.  On Eliquis 5 mg twice daily and rosuvastatin 20 mg  daily.  Hyperlipidemia: Last lipid profile on 02/07/2023 indicated total cholesterol 116, triglycerides 181, HDL 39 and LDL 51.  Continue rosuvastatin 20 mg daily.  Sleep apnea: Patient reports increased snoring in the last few months.  His wife notes that he seems to hold his breath or gasp at night.  He has not worn a CPAP in many years.  He is agreeable to a split-night sleep study.  STOP-BANG 6.     Disposition: F/u with Afib clinic two weeks after cardioversion, F/u Reather Littler, NP in 6-8 weeks.   Signed, Rip Harbour, NP

## 2023-07-29 ENCOUNTER — Ambulatory Visit: Payer: Medicare HMO | Attending: Cardiology | Admitting: Cardiology

## 2023-07-29 ENCOUNTER — Encounter: Payer: Self-pay | Admitting: Cardiology

## 2023-07-29 VITALS — BP 110/62 | HR 115 | Ht 73.0 in | Wt 242.0 lb

## 2023-07-29 DIAGNOSIS — E782 Mixed hyperlipidemia: Secondary | ICD-10-CM

## 2023-07-29 DIAGNOSIS — R931 Abnormal findings on diagnostic imaging of heart and coronary circulation: Secondary | ICD-10-CM

## 2023-07-29 DIAGNOSIS — G4733 Obstructive sleep apnea (adult) (pediatric): Secondary | ICD-10-CM

## 2023-07-29 DIAGNOSIS — I2510 Atherosclerotic heart disease of native coronary artery without angina pectoris: Secondary | ICD-10-CM

## 2023-07-29 DIAGNOSIS — I4819 Other persistent atrial fibrillation: Secondary | ICD-10-CM | POA: Diagnosis not present

## 2023-07-29 MED ORDER — METOPROLOL TARTRATE 25 MG PO TABS: 25.0000 mg | ORAL_TABLET | Freq: Two times a day (BID) | ORAL | 3 refills | Status: DC

## 2023-07-29 NOTE — Patient Instructions (Signed)
Medication Instructions:  Start Metoprolol Tartrate 25 mg twice a day *If you need a refill on your cardiac medications before your next appointment, please call your pharmacy*   Lab Work: Today we will draw CBC, BMP, Mag, and TSH If you have labs (blood work) drawn today and your tests are completely normal, you will receive your results only by: MyChart Message (if you have MyChart) OR A paper copy in the mail If you have any lab test that is abnormal or we need to change your treatment, we will call you to review the results.   Testing/Procedures: Split night sleep study.      Dear Ronald Davis  You are scheduled for a Cardioversion on Friday, December 27 with Dr. Izora Ribas.  Please arrive at the [facility] (Main Entrance A) at [facility]: [address] [place], [place] [address] at 6:30 AM (This time is 1 hour(s) before your procedure to ensure your preparation).   Free valet parking service is available. You will check in at ADMITTING.   *Please Note: You will receive a call the day before your procedure to confirm the appointment time. That time may have changed from the original time based on the schedule for that day.*    DIET:  Nothing to eat or drink after midnight except a sip of water with medications (see medication instructions below)  MEDICATION INSTRUCTIONS: !!IF ANY NEW MEDICATIONS ARE STARTED AFTER TODAY, PLEASE NOTIFY YOUR PROVIDER AS SOON AS POSSIBLE!!  FYI: Medications such as Semaglutide (Ozempic, [place]), Tirzepatide (Mounjaro, Zepbound), Dulaglutide (Trulicity), etc ("GLP1 agonists") AND Canagliflozin (Invokana), Dapagliflozin (Farxiga), Empagliflozin (Jardiance), Ertugliflozin (Steglatro), Bexagliflozin [employer]) or any combination with one of these drugs such as Invokamet (Canagliflozin/Metformin), Synjardy (Empagliflozin/Metformin), etc ("SGLT2 inhibitors") must be held around the time of a procedure. This is not a comprehensive list of all  of these drugs. Please review all of your medications and talk to your provider if you take any one of these. If you are not sure, ask your provider.  Continue taking your anticoagulant (blood thinner): Apixaban (Eliquis).  You will need to continue this after your procedure until you are told by your provider that it is safe to stop.    LABS:   Taken care of  FYI:  For your safety, and to allow [place] to monitor your vital signs accurately during the surgery/procedure we request: If you have artificial nails, gel coating, SNS etc, please have those removed prior to your surgery/procedure. Not having the nail coverings /polish removed may result in cancellation or delay of your surgery/procedure.  Your support person will be asked to wait in the waiting room during your procedure.  It is OK to have someone drop you off and come back when you are ready to be discharged.  You cannot drive after the procedure and will need someone to drive you home.  Bring your insurance cards.  *Special Note: Every effort is made to have your procedure done on time. Occasionally there are emergencies that occur at the hospital that may cause delays. Please be patient if a delay does occur.   Follow-Up: At [facility], you and your health needs are our priority.  As part of our continuing mission to provide you with exceptional heart care, we have created designated Provider Care Teams.  These Care Teams include your primary Cardiologist (physician) and Advanced Practice Providers (APPs -  Physician Assistants and Nurse Practitioners) who all work together to provide you with the care  you need, when you need it.  We recommend signing up for the patient portal called "MyChart".  Sign up information is provided on this After Visit Summary.  MyChart is used to connect with patients for Virtual Visits (Telemedicine).  Patients are able to view lab/test results, encounter notes, upcoming appointments, etc.   Non-urgent messages can be sent to your provider as well.   To learn more about what you can do with MyChart, go to [email or web address].    Your next appointment:   8 week(s)  Provider:   Reather Littler, NP  Other Instructions F/u with A FIB clinic in 4 weeks

## 2023-07-30 LAB — CBC
Hematocrit: 53.2 % — ABNORMAL HIGH (ref 37.5–51.0)
Hemoglobin: 16.9 g/dL (ref 13.0–17.7)
MCH: 27.6 pg (ref 26.6–33.0)
MCHC: 31.8 g/dL (ref 31.5–35.7)
MCV: 87 fL (ref 79–97)
Platelets: 204 10*3/uL (ref 150–450)
RBC: 6.13 x10E6/uL — ABNORMAL HIGH (ref 4.14–5.80)
RDW: 14.1 % (ref 11.6–15.4)
WBC: 8.7 10*3/uL (ref 3.4–10.8)

## 2023-07-30 LAB — BASIC METABOLIC PANEL
BUN/Creatinine Ratio: 11 (ref 10–24)
BUN: 16 mg/dL (ref 8–27)
CO2: 23 mmol/L (ref 20–29)
Calcium: 9.6 mg/dL (ref 8.6–10.2)
Chloride: 101 mmol/L (ref 96–106)
Creatinine, Ser: 1.42 mg/dL — ABNORMAL HIGH (ref 0.76–1.27)
Glucose: 77 mg/dL (ref 70–99)
Potassium: 4.6 mmol/L (ref 3.5–5.2)
Sodium: 137 mmol/L (ref 134–144)
eGFR: 52 mL/min/{1.73_m2} — ABNORMAL LOW (ref >59)

## 2023-07-30 LAB — TSH: TSH: 2.49 u[IU]/mL (ref 0.450–4.500)

## 2023-07-30 LAB — MAGNESIUM: Magnesium: 2.3 mg/dL (ref 1.6–2.3)

## 2023-08-01 ENCOUNTER — Telehealth: Payer: Self-pay

## 2023-08-01 NOTE — Telephone Encounter (Signed)
-----   Message from [facility] sent at 07/31/2023 11:47 AM EST ----- Please let Mr. Nicol know that his CBC shows no evidence of anemia or infection. His kidney function is slightly reduced, recommend he stay well hydrated. His potassium and magnesium levels are normal. His thyroid function is normal. Continue current medications.

## 2023-08-01 NOTE — Telephone Encounter (Signed)
Called patient advised of below they verbalized understanding.

## 2023-08-11 NOTE — Progress Notes (Signed)
 Spoke to patient and instructed them to come at 0630  and to be NPO after 0000.  Medications reviewed.    Confirmed that patient will have a ride home and someone to stay with them for 24 hours after the procedure.

## 2023-08-11 NOTE — Anesthesia Preprocedure Evaluation (Signed)
Anesthesia Evaluation  Patient identified by MRN, date of birth, ID band Patient awake    Reviewed: Allergy & Precautions, NPO status , Patient's Chart, lab work & pertinent test results  Airway Mallampati: II  TM Distance: >3 FB Neck ROM: Full    Dental  (+) Chipped,    Pulmonary sleep apnea    Pulmonary exam normal        Cardiovascular + CAD  + dysrhythmias Atrial Fibrillation  Rhythm:Irregular Rate:Tachycardia  ECG: a-fib with RVR, rate 103   Neuro/Psych negative neurological ROS  negative psych ROS   GI/Hepatic negative GI ROS, Neg liver ROS,,,  Endo/Other  negative endocrine ROS    Renal/GU negative Renal ROS     Musculoskeletal negative musculoskeletal ROS (+)    Abdominal  (+) + obese  Peds  Hematology HLD   Anesthesia Other Findings persistent atrial fibrillation with RVR  Reproductive/Obstetrics                              Anesthesia Physical Anesthesia Plan  ASA: 3  Anesthesia Plan: General   Post-op Pain Management: Minimal or no pain anticipated   Induction: Intravenous  PONV Risk Score and Plan: 2 and Propofol infusion and Treatment may vary due to age or medical condition  Airway Management Planned: Natural Airway and Simple Face Mask  Additional Equipment: None  Intra-op Plan:   Post-operative Plan: Extubation in OR  Informed Consent: I have reviewed the patients History and Physical, chart, labs and discussed the procedure including the risks, benefits and alternatives for the proposed anesthesia with the patient or authorized representative who has indicated his/her understanding and acceptance.     Dental advisory given  Plan Discussed with: CRNA, Anesthesiologist and Surgeon  Anesthesia Plan Comments: ( ECHO 20  1. Left ventricular ejection fraction, by visual estimation, is 60 to  65%. The left ventricle has normal function. There is no left  ventricular  hypertrophy.   2. Left ventricular diastolic function could not be evaluated.   3. Global right ventricle has normal systolic function.The right  ventricular size is normal. No increase in right ventricular wall  thickness.   4. Left atrial size was normal.   5. Right atrial size was normal.   6. The mitral valve is normal in structure. Mild mitral valve  regurgitation. No evidence of mitral stenosis.   7. The tricuspid valve is normal in structure. Tricuspid valve  regurgitation is mild.   8. The aortic valve is normal in structure. Aortic valve regurgitation is  not visualized. Mild to moderate aortic valve sclerosis/calcification  without any evidence of aortic stenosis.   9. The pulmonic valve was normal in structure. Pulmonic valve  regurgitation is not visualized.  10. Normal pulmonary artery systolic pressure.  11. The inferior vena cava is normal in size with greater than 50%  respiratory variability, suggesting right atrial pressure of 3 mmHg.    )         Anesthesia Quick Evaluation

## 2023-08-12 ENCOUNTER — Ambulatory Visit (HOSPITAL_COMMUNITY)
Admission: RE | Admit: 2023-08-12 | Discharge: 2023-08-12 | Disposition: A | Discharge status: Home / Self Care | Payer: Medicare HMO | Attending: Internal Medicine | Admitting: Internal Medicine

## 2023-08-12 ENCOUNTER — Ambulatory Visit (HOSPITAL_COMMUNITY): Payer: Medicare HMO | Admitting: Anesthesiology

## 2023-08-12 ENCOUNTER — Encounter (HOSPITAL_COMMUNITY)
Admission: RE | Disposition: A | Discharge status: Home / Self Care | Payer: Self-pay | Source: Home / Self Care | Attending: Internal Medicine

## 2023-08-12 ENCOUNTER — Encounter (HOSPITAL_COMMUNITY): Payer: Self-pay | Admitting: Internal Medicine

## 2023-08-12 ENCOUNTER — Other Ambulatory Visit: Payer: Self-pay

## 2023-08-12 DIAGNOSIS — Z7901 Long term (current) use of anticoagulants: Secondary | ICD-10-CM | POA: Diagnosis not present

## 2023-08-12 DIAGNOSIS — G4730 Sleep apnea, unspecified: Secondary | ICD-10-CM | POA: Diagnosis not present

## 2023-08-12 DIAGNOSIS — I2510 Atherosclerotic heart disease of native coronary artery without angina pectoris: Secondary | ICD-10-CM | POA: Insufficient documentation

## 2023-08-12 DIAGNOSIS — I4891 Unspecified atrial fibrillation: Secondary | ICD-10-CM

## 2023-08-12 DIAGNOSIS — E785 Hyperlipidemia, unspecified: Secondary | ICD-10-CM | POA: Diagnosis not present

## 2023-08-12 DIAGNOSIS — I4819 Other persistent atrial fibrillation: Secondary | ICD-10-CM

## 2023-08-12 HISTORY — PX: CARDIOVERSION: EP1203

## 2023-08-12 SURGERY — CARDIOVERSION (CATH LAB)
Anesthesia: General

## 2023-08-12 MED ORDER — SODIUM CHLORIDE 0.9 % IV SOLN
INTRAVENOUS | Status: DC
  Administered 2023-08-12: 500 mL via INTRAVENOUS

## 2023-08-12 MED ORDER — SODIUM CHLORIDE 0.9 % IV SOLN
Freq: Once | INTRAVENOUS | Status: DC
Start: 2023-08-12 — End: 2023-08-12

## 2023-08-12 MED ORDER — PROPOFOL 10 MG/ML IV BOLUS
INTRAVENOUS | Status: DC | PRN
  Administered 2023-08-12: 75 mg via INTRAVENOUS

## 2023-08-12 MED ORDER — LIDOCAINE 2% (20 MG/ML) 5 ML SYRINGE
INTRAMUSCULAR | Status: DC | PRN
  Administered 2023-08-12: 60 mg via INTRAVENOUS

## 2023-08-12 SURGICAL SUPPLY — 1 items: PAD DEFIB RADIO PHYSIO CONN (PAD) ×1 IMPLANT

## 2023-08-12 NOTE — Transfer of Care (Signed)
Immediate Anesthesia Transfer of Care Note  Patient: Ronald Davis  Procedure(s) Performed: CARDIOVERSION  Patient Location:  CV  Anesthesia Type:General  Level of Consciousness: sedated and drowsy  Airway & Oxygen Therapy: Patient Spontanous Breathing and Patient connected to nasal cannula oxygen  Post-op Assessment: Report given to RN and Post -op Vital signs reviewed and stable  Post vital signs: Reviewed and stable  Last Vitals:  Vitals Value Taken Time  BP 99/69 08/12/23   Temp    Pulse 70 08/12/23 0747  Resp 23 08/12/23 0747  SpO2 92 % 08/12/23 0747  Vitals shown include unfiled device data.  Last Pain:  Vitals:   08/12/23 0645  TempSrc: Temporal         Complications: No notable events documented.

## 2023-08-12 NOTE — Interval H&P Note (Signed)
History and Physical Interval Note:  08/12/2023 7:32 AM  Ronald Davis  has presented today for surgery, with the diagnosis of AFIB.  The various methods of treatment have been discussed with the patient and family. After consideration of risks, benefits and other options for treatment, the patient has consented to  Procedure(s): CARDIOVERSION (N/A) as a surgical intervention.  The patient's history has been reviewed, patient examined, no change in status, stable for surgery.  I have reviewed the patient's chart and labs.  Questions were answered to the patient's satisfaction.     Dhiya Smits A Demetrios Byron

## 2023-08-12 NOTE — Discharge Instructions (Signed)
 Continue with home meds

## 2023-08-12 NOTE — CV Procedure (Signed)
    Electrical Cardioversion Procedure Note SHIA FRICKEY [id number] May 18, 1951  Procedure: Electrical Cardioversion Indications:  Atrial Fibrillation  Time Out: Verified patient identification, verified procedure,medications/allergies/relevent history reviewed, required imaging and test results available.  Performed  Procedure Details  The patient was NPO after midnight. Anesthesia was administered at the beside  by Dr.Oddono's team with 100 mg propofol.  Cardioversion was done with synchronized biphasic defibrillation with AP pads with 200 Joules.  The patient converted to sinus bradycardia The patient tolerated the procedure well.  IMPRESSION:  Successful cardioversion of atrial fibrillation to sinus bradycardia.   Riley Lam, MD FASE [facility] Cardiologist [facility]  [address] [place], [address] [place], [place] [address] [phone]  7:45 AM

## 2023-08-13 NOTE — Anesthesia Postprocedure Evaluation (Signed)
Anesthesia Post Note  Patient: Ronald Davis  Procedure(s) Performed: CARDIOVERSION     Patient location during evaluation: PACU Anesthesia Type: General Level of consciousness: awake and alert Pain management: pain level controlled Vital Signs Assessment: post-procedure vital signs reviewed and stable Respiratory status: spontaneous breathing, nonlabored ventilation, respiratory function stable and patient connected to nasal cannula oxygen Cardiovascular status: blood pressure returned to baseline and stable Postop Assessment: no apparent nausea or vomiting Anesthetic complications: no   No notable events documented.  Last Vitals:  Vitals:   08/12/23 0845 08/12/23 0850  BP: 92/68 98/67  Pulse: (!) 59 60  Resp: 10 14  Temp:    SpO2: 93% 94%    Last Pain:  Vitals:   08/12/23 0645  TempSrc: Temporal                 [name]

## 2023-08-15 ENCOUNTER — Encounter (HOSPITAL_COMMUNITY): Payer: Self-pay | Admitting: Internal Medicine

## 2023-08-16 DIAGNOSIS — I2510 Atherosclerotic heart disease of native coronary artery without angina pectoris: Secondary | ICD-10-CM

## 2023-08-16 DIAGNOSIS — I4819 Other persistent atrial fibrillation: Secondary | ICD-10-CM

## 2023-08-18 ENCOUNTER — Telehealth: Payer: Self-pay | Admitting: Licensed Clinical Social Worker

## 2023-08-18 NOTE — Telephone Encounter (Signed)
 Called and spoke to patient. Verified name and DOB. Patient had questions about when to take his BP. Advised him to check BP twice daily, in the morning before medication and at night before going to bed. Also advised if elevated to check 1-2 hours after medication. Offered to mail BP log to patient and he said he has a notebook  and will just keep his readings in the notebook.

## 2023-08-18 NOTE — Telephone Encounter (Signed)
 H&V Care Navigation CSW Progress Note  Clinical Social Worker contacted patient by phone to f/u on referral for BP cuff. Note pt had responded back to triage letting them know he could afford a cuff with no issue. Was able to reach him this morning at [phone]. Introduced self, role, reason for call. Pt confirmed no issues with buying a cuff he did have questions about best time to take his pressures etc and I shared I would route that to nursing teams. I also confirmed that he was aware he could try and contact insurance company as well to see it they would cover it. No additional questions/concerns at this time.  Patient is participating in a Managed Medicaid Plan:  No, [employer] Medicare  SDOH Screenings   Food Insecurity: Low Risk  (03/03/2023)   Received from [facility]  Housing: Low Risk  (03/03/2023)   Received from [facility]  Transportation Needs: No Transportation Needs (03/03/2023)   Received from [facility]  Utilities: Low Risk  (03/03/2023)   Received from [facility]  Financial Resource Strain: Low Risk  (06/11/2020)   Received from [facility] visits prior to 10/16/2022., [facility] visits prior to 10/16/2022.  Physical Activity: Insufficiently Active (02/01/2022)   Received from [facility] visits prior to 10/16/2022., [facility], [facility] visits prior to 10/16/2022., [facility]  Social Connections: Moderately Integrated (02/01/2022)   Received from [facility] visits prior to 10/16/2022., [facility], [facility] visits prior to 10/16/2022., [facility]  Stress: No Stress Concern Present (02/01/2022)   [facility] from [facility], [facility] visits prior to 10/16/2022., [facility] visits prior to 10/16/2022., [facility]  Tobacco Use: Low Risk  (08/12/2023)    Marit Lark, MSW,  LCSW Clinical Social Worker [facility] Heart/Vascular Care Navigation  [phone]- work cell phone (preferred) [phone]- desk phone

## 2023-08-29 ENCOUNTER — Ambulatory Visit (HOSPITAL_COMMUNITY)
Admission: RE | Admit: 2023-08-29 | Discharge: 2023-08-29 | Discharge status: Home / Self Care | Payer: Medicare HMO | Source: Ambulatory Visit | Attending: Internal Medicine | Admitting: Internal Medicine

## 2023-08-29 ENCOUNTER — Other Ambulatory Visit (HOSPITAL_COMMUNITY): Payer: Self-pay

## 2023-08-29 VITALS — BP 108/76 | HR 96 | Ht 73.0 in | Wt 246.4 lb

## 2023-08-29 DIAGNOSIS — Z7901 Long term (current) use of anticoagulants: Secondary | ICD-10-CM | POA: Diagnosis not present

## 2023-08-29 DIAGNOSIS — I4891 Unspecified atrial fibrillation: Secondary | ICD-10-CM | POA: Diagnosis not present

## 2023-08-29 DIAGNOSIS — E669 Obesity, unspecified: Secondary | ICD-10-CM | POA: Insufficient documentation

## 2023-08-29 DIAGNOSIS — Z79899 Other long term (current) drug therapy: Secondary | ICD-10-CM | POA: Diagnosis not present

## 2023-08-29 DIAGNOSIS — E785 Hyperlipidemia, unspecified: Secondary | ICD-10-CM | POA: Diagnosis not present

## 2023-08-29 DIAGNOSIS — I480 Paroxysmal atrial fibrillation: Secondary | ICD-10-CM | POA: Diagnosis not present

## 2023-08-29 DIAGNOSIS — D6869 Other thrombophilia: Secondary | ICD-10-CM | POA: Diagnosis not present

## 2023-08-29 DIAGNOSIS — G4733 Obstructive sleep apnea (adult) (pediatric): Secondary | ICD-10-CM | POA: Insufficient documentation

## 2023-08-29 DIAGNOSIS — Z6832 Body mass index (BMI) 32.0-32.9, adult: Secondary | ICD-10-CM | POA: Insufficient documentation

## 2023-08-29 MED ORDER — AMIODARONE HCL 200 MG PO TABS: ORAL_TABLET | ORAL | 1 refills | Status: DC

## 2023-08-29 NOTE — Patient Instructions (Addendum)
 Start Amiodarone- Take 200 mg by mouth twice daily x 1 month and then will decrease to 1 tablet by mouth daily

## 2023-08-29 NOTE — Progress Notes (Signed)
 Primary Care Physician: Debrah Josette ORN., PA-C Primary Cardiologist: Dr Barbaraann Primary Electrophysiologist: none Referring Physician: Scot Ford PA   Ronald Davis is a 73 y.o. male with a history of coronary calcification, obstructive sleep apnea s/p tonsillectomy/uvulectomy and rhinoplasty, hyperlipidemia and persistent atrial fibrillation who presents for consultation in the [facility] Atrial Fibrillation Clinic.  The patient was initially diagnosed with atrial fibrillation in October 2020 by his primary care provider who later referred him to [facility] cardiovascular.  Dr. Elmira started him on Eliquis  for a CHADS2VASC score of 2 and Cardizem .  It was recommended for him to proceed with cardioversion, he later saw Dr. Barbaraann on 07/03/2019 for second opinion.  His symptom has improved after starting on the diltiazem .  Initially Dr. Barbaraann recommended him to proceed with cardioversion recommended by Dr. Elmira, however later patient establish wished to stay with Dr. Barbaraann. He underwent TEE/DCCV on 07/20/2019. Unfortunately, patient was back in afib with symptoms of dizziness, exercise intolerance, and generalized weakness. He is unsure when he converted. He does have a history of alcohol use but has not had any drinks recently.   On follow up today, patient is s/p afib ablation on 09/04/19. Patient reports that he did have two episodes of heart racing post ablation which lasted about 15 minutes each and then resolved. He denies CP, swallowing or groin issues.  On follow up 08/29/23, he is currently in Afib. S/p successful DCCV on 08/12/23 after being seen by Cardiology on 12/13 and noted to be in Afib with RVR. He has not missed any doses of Eliquis  5 mg BID. He takes Lopressor  25 mg BID.   Today, he denies symptoms of chest pain, shortness of breath, orthopnea, PND, lower extremity edema, presyncope, syncope, snoring, daytime somnolence, bleeding, or neurologic sequela. The patient is  tolerating medications without difficulties and is otherwise without complaint today.   Atrial Fibrillation Risk Factors:  he does have symptoms or diagnosis of sleep apnea. he is not on CPAP therapy. S/p surgery.  he does not have a history of rheumatic fever. he does have a history of alcohol use. The patient does have a history of early familial atrial fibrillation or other arrhythmias. Father and brother have afib.  he has a BMI of Body mass index is 32.51 kg/m.SABRA Filed Weights   08/29/23 0923  Weight: 111.8 kg     Family History  Problem Relation Age of Onset   Stroke Mother    Prostate cancer Father    CAD Father        CABG   Stroke Father    Colon polyps Sister    Colon polyps Sister    Colon polyps Sister    Colon polyps Sister    Colon polyps Brother    Colon polyps Brother    Colon cancer Maternal Grandmother    Esophageal cancer Neg Hx    Rectal cancer Neg Hx    Stomach cancer Neg Hx      Atrial Fibrillation Management history:  Previous antiarrhythmic drugs: amiodarone  Previous cardioversions: 07/20/19, 08/12/23 Previous ablations: 09/04/19 CHADS2VASC score: 2 Anticoagulation history: Eliquis    Past Medical History:  Diagnosis Date   Allergy    Cataract    Coronary artery disease    Hyperlipidemia    Low BP    Paroxysmal atrial fibrillation (HCC)    Sleep apnea    no longer as issue per pt 04-15-21   Past Surgical History:  Procedure Laterality Date   ATRIAL  FIBRILLATION ABLATION N/A 09/04/2019   Procedure: ATRIAL FIBRILLATION ABLATION;  Surgeon: Kelsie Agent, MD;  Location: [facility] INVASIVE CV LAB;  Service: Cardiovascular;  Laterality: N/A;   CARDIOVERSION N/A 07/20/2019   Procedure: CARDIOVERSION;  Surgeon: Okey Vina GAILS, MD;  Location: [facility] ENDOSCOPY;  Service: Cardiovascular;  Laterality: N/A;   CARDIOVERSION N/A 08/12/2023   Procedure: CARDIOVERSION;  Surgeon: Santo Stanly LABOR, MD;  Location: [facility] INVASIVE CV LAB;  Service: Cardiovascular;   Laterality: N/A;   COLONOSCOPY     ears     ears pinned back   INGUINAL HERNIA REPAIR     right   MOLE REMOVAL     POLYPECTOMY     RHINOPLASTY      for sleep apnea   TEE WITHOUT CARDIOVERSION N/A 07/20/2019   Procedure: TRANSESOPHAGEAL ECHOCARDIOGRAM (TEE);  Surgeon: Okey Vina GAILS, MD;  Location: [facility] ENDOSCOPY;  Service: Cardiovascular;  Laterality: N/A;   tonisllectomy      Current Outpatient Medications  Medication Sig Dispense Refill   acetaminophen  (TYLENOL ) 500 MG tablet Take 500 mg by mouth as needed for headache.     amiodarone  (PACERONE ) 200 MG tablet Take 1 tablet by mouth twice daily for 1 month and then decrease to 1 tablet by mouth daily 60 tablet 1   ELIQUIS  5 MG TABS tablet TAKE 1 TABLET BY MOUTH 2 TIMES A DAY 60 tablet 5   HYDROcodone  bit-homatropine (HYCODAN) 5-1.5 MG/5ML syrup Take 5 mLs by mouth every 6 (six) hours as needed for cough.     metoprolol  tartrate (LOPRESSOR ) 25 MG tablet Take 1 tablet (25 mg total) by mouth 2 (two) times daily. 180 tablet 3   rosuvastatin  (CRESTOR ) 20 MG tablet TAKE 1 TABLET BY MOUTH EVERYDAY AT BEDTIME 90 tablet 3   tamsulosin (FLOMAX) 0.4 MG CAPS capsule Take 0.8 mg by mouth daily.     traZODone  (DESYREL ) 50 MG tablet TAKE 1 TABLET BY MOUTH EVERYDAY AT BEDTIME 90 tablet 3   No current facility-administered medications for this encounter.    Allergies  Allergen Reactions   Naproxen Other (See Comments)    GI bleed   Amoxicillin Diarrhea        Tomato Other (See Comments)    Eczema, with red tomatoes only    ROS- All systems are reviewed and negative except as per the HPI above.  Physical Exam: Vitals:   08/29/23 0923  BP: 108/76  Pulse: 96  Weight: 111.8 kg  Height: 6' 1 (1.854 m)    GEN- The patient is well appearing, alert and oriented x 3 today.   Neck - no JVD or carotid bruit noted Lungs- Clear to ausculation bilaterally, normal work of breathing Heart- Irregular rate and rhythm, no murmurs, rubs or gallops,  PMI not laterally displaced Extremities- no clubbing, cyanosis, or edema Skin - no rash or ecchymosis noted   Wt Readings from Last 3 Encounters:  08/29/23 111.8 kg  07/29/23 109.8 kg  02/17/23 108.9 kg    EKG today demonstrates  Vent. rate 96 BPM PR interval * ms QRS duration 88 ms QT/QTcB 316/399 ms P-R-T axes * 67 38 Atrial fibrillation Abnormal ECG When compared with ECG of 12-Aug-2023 07:54, PREVIOUS ECG IS PRESENT  Echo 07/24/19 demonstrated  1. Left ventricular ejection fraction, by visual estimation, is 60 to 65%. The left ventricle has normal function. There is no left ventricular hypertrophy.  2. Left ventricular diastolic function could not be evaluated.  3. Global right ventricle has normal systolic function.The right ventricular  size is normal. No increase in right ventricular wall thickness.  4. Left atrial size was normal.  5. Right atrial size was normal.  6. The mitral valve is normal in structure. Mild mitral valve regurgitation. No evidence of mitral stenosis.  7. The tricuspid valve is normal in structure. Tricuspid valve regurgitation is mild.  8. The aortic valve is normal in structure. Aortic valve regurgitation is not visualized. Mild to moderate aortic valve sclerosis/calcification without any evidence of aortic stenosis.  9. The pulmonic valve was normal in structure. Pulmonic valve regurgitation is not visualized. 10. Normal pulmonary artery systolic pressure. 11. The inferior vena cava is normal in size with greater than 50% respiratory variability, suggesting right atrial pressure of 3 mmHg.  LA 3.3 cm  Epic records are reviewed at length today  Assessment and Plan:  1. Paroxysmal atrial fibrillation S/p TEE/DCCV on 07/20/19 with ERAF. Patient spontaneously converted back to SR. S/p afib ablation 09/04/19 with Dr Kelsie. S/p successful DCCV on 08/12/23.  Patient is currently in Afib. We discussed options for treatment considering he has had  ERAF. After discussion, patient would like to begin amiodarone  load of 200 mg BID x 1 month then transition to 200 mg daily. We discussed the possibility he may need a repeat cardioversion if he is still in Afib in several weeks. He would like to discuss repeat ablation with EP.  Continue Eliquis  5 mg BID. Continue diltiazem  120 mg daily  This patients CHA2DS2-VASc Score and unadjusted Ischemic Stroke Rate (% per year) is equal to 2.2 % stroke rate/year from a score of 2  Above score calculated as 1 point each if present [CHF, HTN, DM, Vascular=MI/PAD/Aortic Plaque, Age if 65-74, or Male] Above score calculated as 2 points each if present [Age > 75, or Stroke/TIA/TE]   2. Obesity Body mass index is 32.51 kg/m. Lifestyle modification was discussed and encouraged including regular physical activity and weight reduction.  3. Obstructive sleep apnea The importance of adequate treatment of sleep apnea was discussed today in order to improve our ability to maintain sinus rhythm long term. S/p tonsillectomy/uvulectomy and rhinoplasty.   Will refer to EP to discuss ablation.    Dorn Heinrich, PA-C Afib Clinic [facility] [address] [place], [place] [address] [phone] 08/29/2023 10:48 AM

## 2023-09-12 ENCOUNTER — Telehealth: Payer: Self-pay | Admitting: Cardiovascular Disease

## 2023-09-12 NOTE — Telephone Encounter (Signed)
STAT if patient feels like he/she is going to faint   1. Are you feeling dizzy, lightheaded, or faint right now?   No.  Patient stated he was lying down and gets dizzy on standing up  2. Have you passed out?    No (If yes move to .SYNCOPECHMG)   3. Do you have any other symptoms?   Patient stated both arms feel numb as though they have "fallen asleep"  4. Have you checked your HR and BP (record if available)?      HR 75  BP not available at time of call  Patient stated he is concerned about the numbness in his arms and wants advice on next steps.

## 2023-09-12 NOTE — Telephone Encounter (Signed)
Earlier this afternoon he felt dizzy when he stood up- may have stood up too fast. He said that Dr Flora Lipps is aware of this problem- has been going on for a while  Pt reports when he was laying down, on his back (not on one side or the other) both of his arms went to sleep.  But he got up and moved around and they are no longer numb. He can move his arms around normally. He reports no numbness/tingling, no dizziness, no blurred vision/visual changes, no facial drooping, speech is normal, no headache, no shortness of breath.    BP 137/74 hr 87    130/75 hr 85  Informed him that I would send this information to his provider and would be back in touch with any recommendations. In the meantime, if he has numbness again- move around to see if it goes away/change positions to get better blood flow. He verbalized understanding.

## 2023-09-13 NOTE — Telephone Encounter (Signed)
  I think dizziness with standing can happen, not worrisome. Would drink plenty of water and stand slowly.  The numbness was probably positional if it got better with movement. Would keep an eye on this for now.  Gerri Spore T. Flora Lipps, MD, [facility]  [facility] [address], [address] [place], [place] [address] [phone] 6:56 PM

## 2023-09-13 NOTE — Telephone Encounter (Signed)
Called spoke to patient .  Information given per Dr Flora Lipps  comments.  Patient states he is doing well today no issues.  patient has an appointment Afib clinic on 09/19/23  Patient voiced understanding

## 2023-09-19 ENCOUNTER — Encounter (HOSPITAL_COMMUNITY): Payer: Self-pay | Admitting: Internal Medicine

## 2023-09-19 ENCOUNTER — Ambulatory Visit (HOSPITAL_COMMUNITY)
Admission: RE | Admit: 2023-09-19 | Discharge: 2023-09-19 | Disposition: A | Discharge status: Home / Self Care | Payer: Medicare HMO | Source: Ambulatory Visit | Attending: Internal Medicine | Admitting: Internal Medicine

## 2023-09-19 ENCOUNTER — Other Ambulatory Visit (HOSPITAL_COMMUNITY): Payer: Self-pay | Admitting: Internal Medicine

## 2023-09-19 VITALS — BP 112/84 | HR 71 | Ht 73.0 in | Wt 251.6 lb

## 2023-09-19 DIAGNOSIS — I4819 Other persistent atrial fibrillation: Secondary | ICD-10-CM

## 2023-09-19 DIAGNOSIS — Z5181 Encounter for therapeutic drug level monitoring: Secondary | ICD-10-CM | POA: Diagnosis not present

## 2023-09-19 DIAGNOSIS — G4733 Obstructive sleep apnea (adult) (pediatric): Secondary | ICD-10-CM | POA: Diagnosis not present

## 2023-09-19 DIAGNOSIS — E785 Hyperlipidemia, unspecified: Secondary | ICD-10-CM | POA: Diagnosis not present

## 2023-09-19 DIAGNOSIS — D6869 Other thrombophilia: Secondary | ICD-10-CM

## 2023-09-19 DIAGNOSIS — Z79899 Other long term (current) drug therapy: Secondary | ICD-10-CM | POA: Diagnosis not present

## 2023-09-19 LAB — CBC
HCT: 52.1 % — ABNORMAL HIGH (ref 39.0–52.0)
Hemoglobin: 16.9 g/dL (ref 13.0–17.0)
MCH: 28.4 pg (ref 26.0–34.0)
MCHC: 32.4 g/dL (ref 30.0–36.0)
MCV: 87.4 fL (ref 80.0–100.0)
Platelets: 183 10*3/uL (ref 150–400)
RBC: 5.96 MIL/uL — ABNORMAL HIGH (ref 4.22–5.81)
RDW: 13.9 % (ref 11.5–15.5)
WBC: 7.3 10*3/uL (ref 4.0–10.5)
nRBC: 0 % (ref 0.0–0.2)

## 2023-09-19 LAB — BASIC METABOLIC PANEL
Anion gap: 13 (ref 5–15)
BUN: 14 mg/dL (ref 8–23)
CO2: 18 mmol/L — ABNORMAL LOW (ref 22–32)
Calcium: 9 mg/dL (ref 8.9–10.3)
Chloride: 106 mmol/L (ref 98–111)
Creatinine, Ser: 1.39 mg/dL — ABNORMAL HIGH (ref 0.61–1.24)
GFR, Estimated: 53 mL/min — ABNORMAL LOW (ref >60)
Glucose, Bld: 78 mg/dL (ref 70–99)
Potassium: 4 mmol/L (ref 3.5–5.1)
Sodium: 137 mmol/L (ref 135–145)

## 2023-09-19 MED ORDER — AMIODARONE HCL 200 MG PO TABS: ORAL_TABLET | ORAL | 0 refills | Status: DC

## 2023-09-19 NOTE — Patient Instructions (Addendum)
Take Amiodarone 200 mg - 1 tablet twice daily x 1 week and will decrease after to 1 tablet daily    Cardioversion scheduled for: February. 10th 2025    - Arrive at the [facility] and go to admitting at 9:30 am   - Do not eat or drink anything after midnight the night prior to your procedure.   - Take all your morning medication (except diabetic medications) with a sip of water prior to arrival.  - You will not be able to drive home after your procedure.    - Do NOT miss any doses of your blood thinner - if you should miss a dose please notify our office immediately.   - If you feel as if you go back into normal rhythm prior to scheduled cardioversion, please notify our office immediately.   If your procedure is canceled in the cardioversion suite you will be charged a cancellation fee.          For those patients who have a scheduled procedure/anesthesia on the same day of the week as their dose, hold the medication on the day of surgery.  They can take their scheduled dose the week before.  **Patients on the above medications scheduled for elective procedures that have not held the medication for the appropriate amount of time are at risk of cancellation or change in the anesthetic plan.

## 2023-09-19 NOTE — Progress Notes (Addendum)
Primary Care Physician: Richmond Campbell., PA-C Primary Cardiologist: Dr Flora Lipps Primary Electrophysiologist: none Referring Physician: Azalee Course PA   Ronald Davis is a 73 y.o. male with a history of coronary calcification, obstructive sleep apnea s/p tonsillectomy/uvulectomy and rhinoplasty, hyperlipidemia and persistent atrial fibrillation who presents for consultation in the [facility].  The patient was initially diagnosed with atrial fibrillation in October 2020 by his primary care provider who later referred him to [facility] cardiovascular.  Dr. Rosemary Holms started him on Eliquis for a CHADS2VASC score of 2 and Cardizem.  It was recommended for him to proceed with cardioversion, he later saw Dr. Flora Lipps on 07/03/2019 for second opinion.  His symptom has improved after starting on the diltiazem.  Initially Dr. Flora Lipps recommended him to proceed with cardioversion recommended by Dr. Rosemary Holms, however later patient establish wished to stay with Dr. Flora Lipps. He underwent TEE/DCCV on 07/20/2019. Unfortunately, patient was back in afib with symptoms of dizziness, exercise intolerance, and generalized weakness. He is unsure when he converted. He does have a history of alcohol use but has not had any drinks recently.   On follow up today, patient is s/p afib ablation on 09/04/19. Patient reports that he did have two episodes of heart racing post ablation which lasted about 15 minutes each and then resolved. He denies CP, swallowing or groin issues.  On follow up 08/29/23, he is currently in Afib. S/p successful DCCV on 08/12/23 after being seen by Cardiology on 12/13 and noted to be in Afib with RVR. He has not missed any doses of Eliquis 5 mg BID. He takes Lopressor 25 mg BID.   On follow up 09/19/23, he is currently in Afib. He began amiodarone load at last visit and has upcoming appointment with EP to discuss repeat ablation. No missed doses of Eliquis.   Today, he denies  symptoms of chest pain, shortness of breath, orthopnea, PND, lower extremity edema, presyncope, syncope, snoring, daytime somnolence, bleeding, or neurologic sequela. The patient is tolerating medications without difficulties and is otherwise without complaint today.   Atrial Fibrillation Risk Factors:  he does have symptoms or diagnosis of sleep apnea. he is not on CPAP therapy. S/p surgery.  he does not have a history of rheumatic fever. he does have a history of alcohol use. The patient does have a history of early familial atrial fibrillation or other arrhythmias. Father and brother have afib.  he has a BMI of Body mass index is 33.19 kg/m.Marland Kitchen Filed Weights   09/19/23 1446  Weight: 114.1 kg      Family History  Problem Relation Age of Onset   Stroke Mother    Prostate cancer Father    CAD Father        CABG   Stroke Father    Colon polyps Sister    Colon polyps Sister    Colon polyps Sister    Colon polyps Sister    Colon polyps Brother    Colon polyps Brother    Colon cancer Maternal Grandmother    Esophageal cancer Neg Hx    Rectal cancer Neg Hx    Stomach cancer Neg Hx      Atrial Fibrillation Management history:  Previous antiarrhythmic drugs: amiodarone Previous cardioversions: 07/20/19, 08/12/23 Previous ablations: 09/04/19 CHADS2VASC score: 2 Anticoagulation history: Eliquis   Past Medical History:  Diagnosis Date   Allergy    Cataract    Coronary artery disease    Hyperlipidemia    Low BP  Paroxysmal atrial fibrillation (HCC)    Sleep apnea    no longer as issue per pt 04-15-21   Past Surgical History:  Procedure Laterality Date   ATRIAL FIBRILLATION ABLATION N/A 09/04/2019   Procedure: ATRIAL FIBRILLATION ABLATION;  Surgeon: Hillis Range, MD;  Location: [facility] INVASIVE CV LAB;  Service: Cardiovascular;  Laterality: N/A;   CARDIOVERSION N/A 07/20/2019   Procedure: CARDIOVERSION;  Surgeon: Pricilla Riffle, MD;  Location: [facility] ENDOSCOPY;  Service:  Cardiovascular;  Laterality: N/A;   CARDIOVERSION N/A 08/12/2023   Procedure: CARDIOVERSION;  Surgeon: Christell Constant, MD;  Location: [facility] INVASIVE CV LAB;  Service: Cardiovascular;  Laterality: N/A;   COLONOSCOPY     ears     ears pinned back   INGUINAL HERNIA REPAIR     right   MOLE REMOVAL     POLYPECTOMY     RHINOPLASTY      for sleep apnea   TEE WITHOUT CARDIOVERSION N/A 07/20/2019   Procedure: TRANSESOPHAGEAL ECHOCARDIOGRAM (TEE);  Surgeon: Pricilla Riffle, MD;  Location: [facility] ENDOSCOPY;  Service: Cardiovascular;  Laterality: N/A;   tonisllectomy      Current Outpatient Medications  Medication Sig Dispense Refill   acetaminophen (TYLENOL) 500 MG tablet Take 500 mg by mouth as needed for headache.     amiodarone (PACERONE) 200 MG tablet Take 1 tablet by mouth twice daily for 1 month and then decrease to 1 tablet by mouth daily 60 tablet 1   ELIQUIS 5 MG TABS tablet TAKE 1 TABLET BY MOUTH 2 TIMES A DAY 60 tablet 5   HYDROcodone bit-homatropine (HYCODAN) 5-1.5 MG/5ML syrup Take 5 mLs by mouth every 6 (six) hours as needed for cough.     metoprolol tartrate (LOPRESSOR) 25 MG tablet Take 1 tablet (25 mg total) by mouth 2 (two) times daily. 180 tablet 3   rosuvastatin (CRESTOR) 20 MG tablet TAKE 1 TABLET BY MOUTH EVERYDAY AT BEDTIME 90 tablet 3   tamsulosin (FLOMAX) 0.4 MG CAPS capsule Take 0.8 mg by mouth daily.     traZODone (DESYREL) 50 MG tablet TAKE 1 TABLET BY MOUTH EVERYDAY AT BEDTIME 90 tablet 3   No current facility-administered medications for this encounter.    Allergies  Allergen Reactions   Naproxen Other (See Comments)    GI bleed   Amoxicillin Diarrhea        Tomato Other (See Comments)    Eczema, with red tomatoes only    ROS- All systems are reviewed and negative except as per the HPI above.  Physical Exam: Vitals:   09/19/23 1446  BP: 112/84  Pulse: 71  Weight: 114.1 kg  Height: 6\' 1"  (1.854 m)     GEN- The patient is well appearing, alert and  oriented x 3 today.   Neck - no JVD or carotid bruit noted Lungs- Clear to ausculation bilaterally, normal work of breathing Heart- Irregular rate and rhythm, no murmurs, rubs or gallops, PMI not laterally displaced Extremities- no clubbing, cyanosis, or edema Skin - no rash or ecchymosis noted   Wt Readings from Last 3 Encounters:  09/19/23 114.1 kg  08/29/23 111.8 kg  07/29/23 109.8 kg    EKG today demonstrates  Vent. rate 71 BPM PR interval * ms QRS duration 90 ms QT/QTcB 406/441 ms P-R-T axes * 58 53 Atrial fibrillation Abnormal ECG When compared with ECG of 29-Aug-2023 09:32, PREVIOUS ECG IS PRESENT  Echo 07/24/19 demonstrated  1. Left ventricular ejection fraction, by visual estimation, is 60 to 65%. The  left ventricle has normal function. There is no left ventricular hypertrophy.  2. Left ventricular diastolic function could not be evaluated.  3. Global right ventricle has normal systolic function.The right ventricular size is normal. No increase in right ventricular wall thickness.  4. Left atrial size was normal.  5. Right atrial size was normal.  6. The mitral valve is normal in structure. Mild mitral valve regurgitation. No evidence of mitral stenosis.  7. The tricuspid valve is normal in structure. Tricuspid valve regurgitation is mild.  8. The aortic valve is normal in structure. Aortic valve regurgitation is not visualized. Mild to moderate aortic valve sclerosis/calcification without any evidence of aortic stenosis.  9. The pulmonic valve was normal in structure. Pulmonic valve regurgitation is not visualized. 10. Normal pulmonary artery systolic pressure. 11. The inferior vena cava is normal in size with greater than 50% respiratory variability, suggesting right atrial pressure of 3 mmHg.  LA 3.3 cm  Epic records are reviewed at length today  Assessment and Plan:  1. Persistent atrial fibrillation S/p TEE/DCCV on 07/20/19 with ERAF. Patient spontaneously  converted back to SR. S/p afib ablation 09/04/19 with Dr Johney Frame. S/p successful DCCV on 08/12/23.  Patient is currently in Afib. He will continue amiodarone 200 mg BID for one week and then transition to 200 mg once daily. We discussed repeat cardioversion to attempt to convert him to NSR. After discussion, he agrees to proceed with cardioversion. Labs drawn today.   Informed Consent   Shared Decision Making/Informed Consent The risks (stroke, cardiac arrhythmias rarely resulting in the need for a temporary or permanent pacemaker, skin irritation or burns and complications associated with conscious sedation including aspiration, arrhythmia, respiratory failure and death), benefits (restoration of normal sinus rhythm) and alternatives of a direct current cardioversion were explained in detail to Ronald Davis and he agrees to proceed.      Continue Eliquis 5 mg BID without interruption. Continue diltiazem 120 mg daily.   This patients CHA2DS2-VASc Score and unadjusted Ischemic Stroke Rate (% per year) is equal to 2.2 % stroke rate/year from a score of 2  Above score calculated as 1 point each if present [CHF, HTN, DM, Vascular=MI/PAD/Aortic Plaque, Age if 65-74, or Male] Above score calculated as 2 points each if present [Age > 75, or Stroke/TIA/TE]    Follow up as scheduled with Cardiology and EP.    Justin Mend, PA-C [facility] [address] [place], [place] [address] [phone] 09/19/2023 3:25 PM

## 2023-09-23 NOTE — Progress Notes (Signed)
 Spoke to pt and instructed them to come at 0815 and to be NPO after 0000.  Confirmed no missed doses of AC and instructed to take in AM with a small sip of water.  Confirmed that pt will have a ride home and someone to stay with them for 24 hours after the procedure. Instructed patient to not wear any jewelry or lotion.

## 2023-09-26 ENCOUNTER — Encounter (HOSPITAL_COMMUNITY): Payer: Self-pay | Admitting: Cardiology

## 2023-09-26 ENCOUNTER — Other Ambulatory Visit: Payer: Self-pay

## 2023-09-26 ENCOUNTER — Ambulatory Visit (HOSPITAL_COMMUNITY): Payer: Medicare HMO | Admitting: Anesthesiology

## 2023-09-26 ENCOUNTER — Ambulatory Visit: Payer: Medicare HMO | Admitting: Cardiology

## 2023-09-26 ENCOUNTER — Ambulatory Visit (HOSPITAL_COMMUNITY)
Admission: RE | Admit: 2023-09-26 | Discharge: 2023-09-26 | Disposition: A | Discharge status: Home / Self Care | Payer: Medicare HMO | Attending: Cardiology | Admitting: Cardiology

## 2023-09-26 ENCOUNTER — Encounter (HOSPITAL_COMMUNITY)
Admission: RE | Disposition: A | Discharge status: Home / Self Care | Payer: Self-pay | Source: Home / Self Care | Attending: Cardiology

## 2023-09-26 DIAGNOSIS — I10 Essential (primary) hypertension: Secondary | ICD-10-CM | POA: Diagnosis not present

## 2023-09-26 DIAGNOSIS — E785 Hyperlipidemia, unspecified: Secondary | ICD-10-CM | POA: Insufficient documentation

## 2023-09-26 DIAGNOSIS — Z79899 Other long term (current) drug therapy: Secondary | ICD-10-CM | POA: Diagnosis not present

## 2023-09-26 DIAGNOSIS — I2510 Atherosclerotic heart disease of native coronary artery without angina pectoris: Secondary | ICD-10-CM

## 2023-09-26 DIAGNOSIS — Z7901 Long term (current) use of anticoagulants: Secondary | ICD-10-CM | POA: Diagnosis not present

## 2023-09-26 DIAGNOSIS — I4819 Other persistent atrial fibrillation: Secondary | ICD-10-CM | POA: Insufficient documentation

## 2023-09-26 DIAGNOSIS — E782 Mixed hyperlipidemia: Secondary | ICD-10-CM | POA: Diagnosis not present

## 2023-09-26 DIAGNOSIS — G4733 Obstructive sleep apnea (adult) (pediatric): Secondary | ICD-10-CM | POA: Diagnosis not present

## 2023-09-26 DIAGNOSIS — I4891 Unspecified atrial fibrillation: Secondary | ICD-10-CM | POA: Diagnosis not present

## 2023-09-26 HISTORY — PX: CARDIOVERSION: EP1203

## 2023-09-26 SURGERY — CARDIOVERSION (CATH LAB)
Anesthesia: General

## 2023-09-26 MED ORDER — METOPROLOL TARTRATE 25 MG PO TABS
12.5000 mg | ORAL_TABLET | Freq: Two times a day (BID) | ORAL | 3 refills | Status: DC
Start: 2023-09-26 — End: 2023-12-25

## 2023-09-26 MED ORDER — LIDOCAINE 2% (20 MG/ML) 5 ML SYRINGE
INTRAMUSCULAR | Status: DC | PRN
  Administered 2023-09-26: 20 mg via INTRAVENOUS

## 2023-09-26 MED ORDER — PROPOFOL 10 MG/ML IV BOLUS
INTRAVENOUS | Status: DC | PRN
Start: 2023-09-26 — End: 2023-09-26
  Administered 2023-09-26: 80 mg via INTRAVENOUS

## 2023-09-26 SURGICAL SUPPLY — 1 items: PAD DEFIB RADIO PHYSIO CONN (PAD) ×1 IMPLANT

## 2023-09-26 NOTE — CV Procedure (Signed)
 Procedure:   DCCV  Indication:  Symptomatic atrial fibrillation  Procedure Note:  The patient signed informed consent.  They have had had therapeutic anticoagulation with Eliquis  greater than 3 weeks.  Anesthesia was administered by Dr. Harwood Lingo and Kennis Peacock, CRNA.  Adequate airway was maintained throughout and vital followed per protocol.  They were cardioverted x 1 with 200J of biphasic synchronized energy.  They converted to sinus [place] with rate 40-50s.  There were no apparent complications.  The patient had normal neuro status and respiratory status post procedure with vitals stable as recorded elsewhere.  Metoprolol  dose decreased to 12.5 mg BID on discharge due to bradycardia  Follow up:  They will continue on current medical therapy and follow up with cardiology as scheduled.  Carson Clara, MD 09/26/2023 10:04 AM

## 2023-09-26 NOTE — Interval H&P Note (Signed)
 History and Physical Interval Note:  09/26/2023 8:44 AM  Marston Skiff  has presented today for surgery, with the diagnosis of AFIB.  The various methods of treatment have been discussed with the patient and family. After consideration of risks, benefits and other options for treatment, the patient has consented to  Procedure(s): CARDIOVERSION (N/A) as a surgical intervention.  The patient's history has been reviewed, patient examined, no change in status, stable for surgery.  I have reviewed the patient's chart and labs.  Questions were answered to the patient's satisfaction.     Wendie Hamburg

## 2023-09-26 NOTE — Anesthesia Preprocedure Evaluation (Signed)
 Anesthesia Evaluation  Patient identified by MRN, date of birth, ID band Patient awake    Reviewed: Allergy & Precautions, NPO status , Patient's Chart, lab work & pertinent test results  Airway Mallampati: II  TM Distance: >3 FB Neck ROM: Full    Dental  (+) Dental Advisory Given   Pulmonary sleep apnea    breath sounds clear to auscultation       Cardiovascular hypertension, Pt. on home beta blockers and Pt. on medications + CAD  + dysrhythmias Atrial Fibrillation  Rhythm:Regular Rate:Normal     Neuro/Psych negative neurological ROS     GI/Hepatic negative GI ROS, Neg liver ROS,,,  Endo/Other  negative endocrine ROS    Renal/GU negative Renal ROS     Musculoskeletal   Abdominal   Peds  Hematology negative hematology ROS (+)   Anesthesia Other Findings   Reproductive/Obstetrics                             Anesthesia Physical Anesthesia Plan  ASA: 3  Anesthesia Plan: General   Post-op Pain Management:    Induction: Intravenous  PONV Risk Score and Plan: 2 and Treatment may vary due to age or medical condition  Airway Management Planned: Natural Airway, Nasal Cannula and Mask  Additional Equipment:   Intra-op Plan:   Post-operative Plan:   Informed Consent: I have reviewed the patients History and Physical, chart, labs and discussed the procedure including the risks, benefits and alternatives for the proposed anesthesia with the patient or authorized representative who has indicated his/her understanding and acceptance.       Plan Discussed with: CRNA  Anesthesia Plan Comments:        Anesthesia Quick Evaluation

## 2023-09-26 NOTE — Transfer of Care (Signed)
 Immediate Anesthesia Transfer of Care Note  Patient: Ronald Davis  Procedure(s) Performed: CARDIOVERSION  Patient Location: PACU and Short Stay  Anesthesia Type:MAC  Level of Consciousness: sedated  Airway & Oxygen Therapy: Patient Spontanous Breathing  Post-op Assessment: Report given to RN  Post vital signs: Reviewed and stable see chart  Last Vitals:  Vitals Value Taken Time  BP    Temp    Pulse    Resp    SpO2      Last Pain:  Vitals:   09/26/23 0844  TempSrc:   PainSc: 0-No pain         Complications: No notable events documented.

## 2023-09-26 NOTE — Discharge Instructions (Signed)

## 2023-09-27 ENCOUNTER — Other Ambulatory Visit: Payer: Self-pay

## 2023-09-27 ENCOUNTER — Encounter: Payer: Self-pay | Admitting: Cardiovascular Disease

## 2023-09-27 NOTE — Telephone Encounter (Signed)
Attempted to call patient. No answer left detailed message. Mychart message also sent

## 2023-09-27 NOTE — Telephone Encounter (Signed)
Medication list updated.

## 2023-09-27 NOTE — Telephone Encounter (Signed)
Patient identification verified by 2 forms. Marilynn Rail, RN    Called and spoke to patient  Patient states:   -completed cardioversion yesterday   -per discharge summary, advised to check HR and BP at home   -At 3:15pm: BP 122/68 HR: 47   -has very slight lightheadedness when he stands up   -this morning he took 25mg  Metoprolol, did not split tablet  Patient denies:   -Falls/LOC   -dizziness   -weakness   -fatigue  Advised patient:   -tomorrow start taking Metoprolol 12.5mg  BID   -monitor BP and HR at home  Reviewed ED warning signs/precautions  Patient verbalized understanding, no questions at this time

## 2023-09-27 NOTE — Progress Notes (Signed)
Metoprolol discontinued by Dr. Flora Lipps. Medication list updated.

## 2023-09-27 NOTE — Anesthesia Postprocedure Evaluation (Signed)
Anesthesia Post Note  Patient: Ronald Davis  Procedure(s) Performed: CARDIOVERSION     Patient location during evaluation: PACU Anesthesia Type: General Level of consciousness: awake and alert Pain management: pain level controlled Vital Signs Assessment: post-procedure vital signs reviewed and stable Respiratory status: spontaneous breathing, nonlabored ventilation, respiratory function stable and patient connected to nasal cannula oxygen Cardiovascular status: blood pressure returned to baseline and stable Postop Assessment: no apparent nausea or vomiting Anesthetic complications: no   No notable events documented.  Last Vitals:  Vitals:   09/26/23 0954 09/26/23 1004  BP: 103/63 106/69  Pulse: (!) 45 (!) 54  Resp: 17 15  Temp:    SpO2: 95% 94%    Last Pain:  Vitals:   09/26/23 1004  TempSrc:   PainSc: 0-No pain                 Kennieth Rad

## 2023-10-04 ENCOUNTER — Other Ambulatory Visit (HOSPITAL_COMMUNITY): Payer: Self-pay

## 2023-10-04 DIAGNOSIS — I4891 Unspecified atrial fibrillation: Secondary | ICD-10-CM

## 2023-10-04 MED ORDER — APIXABAN 5 MG PO TABS: 5.0000 mg | ORAL_TABLET | Freq: Two times a day (BID) | ORAL | Status: DC

## 2023-10-14 ENCOUNTER — Other Ambulatory Visit: Payer: Self-pay

## 2023-10-14 ENCOUNTER — Ambulatory Visit: Payer: Medicare HMO | Attending: Cardiology | Admitting: Cardiology

## 2023-10-14 ENCOUNTER — Encounter: Payer: Self-pay | Admitting: Cardiology

## 2023-10-14 VITALS — BP 110/66 | HR 53 | Ht 73.0 in | Wt 247.0 lb

## 2023-10-14 DIAGNOSIS — D6869 Other thrombophilia: Secondary | ICD-10-CM | POA: Diagnosis not present

## 2023-10-14 DIAGNOSIS — I2510 Atherosclerotic heart disease of native coronary artery without angina pectoris: Secondary | ICD-10-CM

## 2023-10-14 DIAGNOSIS — I4819 Other persistent atrial fibrillation: Secondary | ICD-10-CM

## 2023-10-14 NOTE — Patient Instructions (Signed)
 Medication Instructions:  Your physician recommends that you continue on your current medications as directed. Please refer to the Current Medication list given to you today.  *If you need a refill on your cardiac medications before your next appointment, please call your pharmacy*  Lab Work: BMET and CBC - please go to any [facility] location to have labs drawn within 30 days of your procedure  Testing/Procedures: Cardiac CT  Your physician has requested that you have cardiac CT. Cardiac computed tomography (CT) is a painless test that uses an x-ray machine to take clear, detailed pictures of your heart. For further information please visit [email or web address] Please follow instruction sheet as given. We will call you to schedule your CT scan. It will be done about three weeks prior to your ablation.  Ablation Your physician has recommended that you have an ablation. Catheter ablation is a medical procedure used to treat some cardiac arrhythmias (irregular heartbeats). During catheter ablation, a long, thin, flexible tube is put into a blood vessel in your groin (upper thigh), or neck. This tube is called an ablation catheter. It is then guided to your heart through the blood vessel. Radio frequency waves destroy small areas of heart tissue where abnormal heartbeats may cause an arrhythmia to start. Please see the instruction sheet given to you today.  Follow-Up: At [facility], you and your health needs are our priority.  As part of our continuing mission to provide you with exceptional heart care, we have created designated Provider Care Teams.  These Care Teams include your primary Cardiologist (physician) and Advanced Practice Providers (APPs -  Physician Assistants and Nurse Practitioners) who all work together to provide you with the care you need, when you need it.  Your next appointment:   We will call you to arrange your post-procedure appointments.

## 2023-10-14 NOTE — Progress Notes (Signed)
 Electrophysiology Office Note:   Date:  10/15/2023  ID:  Ronald Davis, DOB 1951-07-07, MRN [id number]  Primary Cardiologist: [name], MD Electrophysiologist: [name], MD      History of Present Illness:   Ronald Davis is a 73 y.o. male with h/o coronary calcification, obstructive sleep apnea s/p tonsillectomy/uvulectomy and rhinoplasty, hyperlipidemia and persistent atrial fibrillation s/p PVI on 09/04/19 who is being seen today for evaluation of his atrial fibrillation at the request of [name], [place].  Discussed the use of AI scribe software for clinical note transcription with the patient, who gave verbal consent to proceed.  History of Present Illness   The patient, a 73 year old with a history of atrial fibrillation (AFib), presents with a recurrence of AFib symptoms following an ablation procedure in 2021. The patient reports a longer than expected recovery period after his first ablation of eight months, during which he experienced intermittent heart palpitations and fatigue, which he attributes to atrial fibrillation. The patient also noticed that alcohol consumption, specifically wine, would trigger AFib symptoms 24 hours later. 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 He presented for a follow up visit on 12/13 with cardiology and was found to be in atrial fibrillation. He underwent DCCV on 12/27. He had follow up with AF Clinic and was back in atrial fibrillation. He has been started on amiodarone and metoprolol was discontinued due to a consistently low heart rate, which was concerning to the patient. The patient is considering another ablation procedure to better manage the AFib and be able to come off amiodarone. No new or acute complaints today.     Review of systems complete and found to be negative unless listed in HPI.   EP Information / Studies Reviewed:     EKG is ordered today. Personal review as below.  EKG Interpretation Date/Time:  Friday October 14 2023 16:32:19 EST Ventricular Rate:  53 PR Interval:  196 QRS Duration:  94 QT Interval:  476 QTC Calculation: 446 R Axis:   39  Text Interpretation: Sinus bradycardia When compared with ECG of 26-Sep-2023 09:44, PR interval has decreased and heart rate increased by 10bpm. Confirmed by [name] [phone]) on 10/15/2023 11:33:31 AM   Echo 07/24/19:  1. Left ventricular ejection fraction, by visual estimation, is 60 to  65%. The left ventricle has normal function. There is no left ventricular  hypertrophy.   2. Left ventricular diastolic function could not be evaluated.   3. Global right ventricle has normal systolic function.The right  ventricular size is normal. No increase in right ventricular wall  thickness.   4. Left atrial size was normal.   5. Right atrial size was normal.   6. The mitral valve is normal in structure. Mild mitral valve  regurgitation. No evidence of mitral stenosis.   7. The tricuspid valve is normal in structure. Tricuspid valve  regurgitation is mild.   8. The aortic valve is normal in structure. Aortic valve regurgitation is  not visualized. Mild to moderate aortic valve sclerosis/calcification  without any evidence of aortic stenosis.   9. The pulmonic valve was normal in structure. Pulmonic valve  regurgitation is not visualized.  10. Normal pulmonary artery systolic pressure.  11. The inferior vena cava is normal in size with greater than 50%  respiratory variability, suggesting right atrial pressure of 3 mmHg.    Risk Assessment/Calculations:  CHA2DS2-VASc Score = 2   This indicates a 2.2% annual risk of stroke. The patient's score is based upon: CHF History: 0 HTN History: 0 Diabetes History: 0 Stroke History: 0 Vascular Disease History: 1 Age Score: 1 Gender Score: 0         Physical Exam:   VS:  BP 110/66   Pulse (!) 53   Ht  6\' 1"  (1.854 m)   Wt 247 lb (112 kg)   BMI 32.59 kg/m    Wt Readings from Last 3 Encounters:  10/14/23 247 lb (112 kg)  09/26/23 243 lb (110.2 kg)  09/19/23 251 lb 9.6 oz (114.1 kg)     GEN: Well nourished, well developed in no acute distress NECK: No JVD CARDIAC: Bradycardic, regular RESPIRATORY:  Clear to auscultation without rales, wheezing or rhonchi  ABDOMEN: Soft, non-distended EXTREMITIES:  No edema; No deformity   ASSESSMENT AND PLAN:   Assessment and Plan    # Symptomatic persistent atrial fibrillation: Recurrent AFib post-ablation in 2021. Notably, during ablation Dr. Johney Frame was unable to isolate RSPV. Posterior wall isolation was not performed.  #. Secondary hypercoagulable state due to atrial fibrillation: CHADSVASC score of 2. -Discussed treatment options today for AF including antiarrhythmic drug therapy and ablation. Discussed risks, recovery and likelihood of success with each treatment strategy. Risk, benefits, and alternatives to EP study and ablation for afib were discussed. These risks include but are not limited to stroke, bleeding, vascular damage, tamponade, perforation, damage to the esophagus, lungs, phrenic nerve and other structures, pulmonary vein stenosis, worsening renal function, coronary vasospasm and death.  Discussed potential need for repeat ablation procedures and antiarrhythmic drugs after an initial ablation. The patient understands these risk and wishes to proceed.  We will therefore proceed with catheter ablation at the next available time.  Carto, ICE, anesthesia are requested for the procedure.  Will also obtain CT PV protocol prior to the procedure to exclude LAA thrombus and further evaluate atrial anatomy. -Continue amiodarone until 1 month post ablation. -Continue Eliquis.  # Coronary Artery Calcification: Elevated coronary artery calcium score (949). No angina. - Continue rosuvastatin. - Will get updated calcium score on pre-ablation  CT.  Follow up with Dr. Jimmey Ralph  3 months after ablation.   Signed, [name], MD

## 2023-10-25 ENCOUNTER — Telehealth (HOSPITAL_COMMUNITY): Payer: Self-pay

## 2023-10-25 NOTE — Telephone Encounter (Signed)
 Attempted to reach patient to discuss upcoming procedure, no answer. Left VM for patient to return call.

## 2023-10-26 ENCOUNTER — Ambulatory Visit (HOSPITAL_COMMUNITY)
Admission: RE | Admit: 2023-10-26 | Discharge: 2023-10-26 | Disposition: A | Discharge status: Home / Self Care | Source: Ambulatory Visit | Attending: Cardiology | Admitting: Cardiology

## 2023-10-26 DIAGNOSIS — I4819 Other persistent atrial fibrillation: Secondary | ICD-10-CM | POA: Insufficient documentation

## 2023-10-26 MED ORDER — IOHEXOL 350 MG/ML SOLN
95.0000 mL | Freq: Once | INTRAVENOUS | Status: AC | PRN
  Administered 2023-10-26: 95 mL via INTRAVENOUS

## 2023-11-05 LAB — BASIC METABOLIC PANEL
BUN/Creatinine Ratio: 11 (ref 10–24)
BUN: 15 mg/dL (ref 8–27)
CO2: 20 mmol/L (ref 20–29)
Calcium: 9.2 mg/dL (ref 8.6–10.2)
Chloride: 103 mmol/L (ref 96–106)
Creatinine, Ser: 1.38 mg/dL — ABNORMAL HIGH (ref 0.76–1.27)
Glucose: 111 mg/dL — ABNORMAL HIGH (ref 70–99)
Potassium: 4.4 mmol/L (ref 3.5–5.2)
Sodium: 139 mmol/L (ref 134–144)
eGFR: 54 mL/min/{1.73_m2} — ABNORMAL LOW (ref >59)

## 2023-11-05 LAB — CBC
Hematocrit: 50 % (ref 37.5–51.0)
Hemoglobin: 16.7 g/dL (ref 13.0–17.7)
MCH: 29 pg (ref 26.6–33.0)
MCHC: 33.4 g/dL (ref 31.5–35.7)
MCV: 87 fL (ref 79–97)
Platelets: 181 10*3/uL (ref 150–450)
RBC: 5.75 x10E6/uL (ref 4.14–5.80)
RDW: 13.7 % (ref 11.6–15.4)
WBC: 6.3 10*3/uL (ref 3.4–10.8)

## 2023-11-11 ENCOUNTER — Telehealth (HOSPITAL_COMMUNITY): Payer: Self-pay

## 2023-11-11 NOTE — Telephone Encounter (Signed)
 Attempted to reach patient to discuss upcoming procedure, no answer. Left VM for patient to return call.

## 2023-11-15 ENCOUNTER — Encounter: Payer: Self-pay | Admitting: Cardiology

## 2023-11-17 NOTE — Pre-Procedure Instructions (Signed)
 Instructed patient on the following items: Arrival time 0800 Nothing to eat or drink after midnight No meds AM of procedure Responsible person to drive you home and stay with you for 24 hrs  Have you missed any doses of anti-coagulant Elqiuis- takes twice a day, hasn't missed any doses.  Don't take dose morning of procedure.

## 2023-11-18 ENCOUNTER — Ambulatory Visit (HOSPITAL_BASED_OUTPATIENT_CLINIC_OR_DEPARTMENT_OTHER): Admitting: Anesthesiology

## 2023-11-18 ENCOUNTER — Other Ambulatory Visit (HOSPITAL_COMMUNITY): Payer: Self-pay | Admitting: Internal Medicine

## 2023-11-18 ENCOUNTER — Encounter (HOSPITAL_COMMUNITY)
Admission: RE | Disposition: A | Discharge status: Home / Self Care | Payer: Self-pay | Source: Home / Self Care | Attending: Cardiology

## 2023-11-18 ENCOUNTER — Ambulatory Visit (HOSPITAL_COMMUNITY)
Admission: RE | Admit: 2023-11-18 | Discharge: 2023-11-18 | Disposition: A | Discharge status: Home / Self Care | Attending: Cardiology | Admitting: Cardiology

## 2023-11-18 ENCOUNTER — Encounter (HOSPITAL_COMMUNITY): Payer: Self-pay | Admitting: Cardiology

## 2023-11-18 ENCOUNTER — Ambulatory Visit (HOSPITAL_COMMUNITY): Admitting: Anesthesiology

## 2023-11-18 ENCOUNTER — Other Ambulatory Visit: Payer: Self-pay

## 2023-11-18 DIAGNOSIS — I4819 Other persistent atrial fibrillation: Secondary | ICD-10-CM

## 2023-11-18 DIAGNOSIS — I2510 Atherosclerotic heart disease of native coronary artery without angina pectoris: Secondary | ICD-10-CM | POA: Insufficient documentation

## 2023-11-18 DIAGNOSIS — E785 Hyperlipidemia, unspecified: Secondary | ICD-10-CM | POA: Diagnosis not present

## 2023-11-18 DIAGNOSIS — Z7901 Long term (current) use of anticoagulants: Secondary | ICD-10-CM | POA: Insufficient documentation

## 2023-11-18 DIAGNOSIS — G4733 Obstructive sleep apnea (adult) (pediatric): Secondary | ICD-10-CM | POA: Insufficient documentation

## 2023-11-18 DIAGNOSIS — Z79899 Other long term (current) drug therapy: Secondary | ICD-10-CM | POA: Insufficient documentation

## 2023-11-18 DIAGNOSIS — D6869 Other thrombophilia: Secondary | ICD-10-CM | POA: Insufficient documentation

## 2023-11-18 HISTORY — PX: ATRIAL FIBRILLATION ABLATION: EP1191

## 2023-11-18 LAB — POCT ACTIVATED CLOTTING TIME
Activated Clotting Time: 302 s
Activated Clotting Time: 320 s

## 2023-11-18 SURGERY — ATRIAL FIBRILLATION ABLATION
Anesthesia: General

## 2023-11-18 MED ORDER — PHENYLEPHRINE 80 MCG/ML (10ML) SYRINGE FOR IV PUSH (FOR BLOOD PRESSURE SUPPORT)
PREFILLED_SYRINGE | INTRAVENOUS | Status: DC | PRN
Start: 2023-11-18 — End: 2023-11-18
  Administered 2023-11-18: 160 ug via INTRAVENOUS

## 2023-11-18 MED ORDER — HEPARIN SODIUM (PORCINE) 1000 UNIT/ML IJ SOLN
INTRAMUSCULAR | Status: AC
  Filled 2023-11-18: qty 10

## 2023-11-18 MED ORDER — FENTANYL CITRATE (PF) 250 MCG/5ML IJ SOLN
INTRAMUSCULAR | Status: DC | PRN
  Administered 2023-11-18 (×2): 50 ug via INTRAVENOUS

## 2023-11-18 MED ORDER — HEPARIN SODIUM (PORCINE) 1000 UNIT/ML IJ SOLN
INTRAMUSCULAR | Status: DC | PRN
  Administered 2023-11-18: 16000 [IU] via INTRAVENOUS
  Administered 2023-11-18: 4000 [IU] via INTRAVENOUS

## 2023-11-18 MED ORDER — ONDANSETRON HCL 4 MG/2ML IJ SOLN: 4.0000 mg | Freq: Four times a day (QID) | INTRAMUSCULAR | Status: DC | PRN

## 2023-11-18 MED ORDER — PHENYLEPHRINE HCL-NACL 20-0.9 MG/250ML-% IV SOLN
INTRAVENOUS | Status: DC | PRN
  Administered 2023-11-18: 50 ug/min via INTRAVENOUS

## 2023-11-18 MED ORDER — SODIUM CHLORIDE 0.9% FLUSH: 3.0000 mL | Freq: Two times a day (BID) | INTRAVENOUS | Status: DC

## 2023-11-18 MED ORDER — PROPOFOL 10 MG/ML IV BOLUS
INTRAVENOUS | Status: DC | PRN
  Administered 2023-11-18: 130 mg via INTRAVENOUS

## 2023-11-18 MED ORDER — ACETAMINOPHEN 325 MG PO TABS: 650.0000 mg | ORAL_TABLET | ORAL | Status: DC | PRN

## 2023-11-18 MED ORDER — DEXAMETHASONE SODIUM PHOSPHATE 10 MG/ML IJ SOLN
INTRAMUSCULAR | Status: DC | PRN
  Administered 2023-11-18: 5 mg via INTRAVENOUS

## 2023-11-18 MED ORDER — SODIUM CHLORIDE 0.9 % IV SOLN: INTRAVENOUS | Status: DC

## 2023-11-18 MED ORDER — ATROPINE SULFATE 1 MG/ML IV SOLN
INTRAVENOUS | Status: DC | PRN
  Administered 2023-11-18: 1 mg via INTRAVENOUS

## 2023-11-18 MED ORDER — HEPARIN (PORCINE) IN NACL 1000-0.9 UT/500ML-% IV SOLN
INTRAVENOUS | Status: DC | PRN
  Administered 2023-11-18 (×3): 500 mL

## 2023-11-18 MED ORDER — ONDANSETRON HCL 4 MG/2ML IJ SOLN
INTRAMUSCULAR | Status: DC | PRN
  Administered 2023-11-18: 4 mg via INTRAVENOUS

## 2023-11-18 MED ORDER — ACETAMINOPHEN 500 MG PO TABS
1000.0000 mg | ORAL_TABLET | Freq: Once | ORAL | Status: AC
  Administered 2023-11-18: 1000 mg via ORAL
  Filled 2023-11-18: qty 2

## 2023-11-18 MED ORDER — LIDOCAINE 2% (20 MG/ML) 5 ML SYRINGE
INTRAMUSCULAR | Status: DC | PRN
Start: 2023-11-18 — End: 2023-11-18
  Administered 2023-11-18: 100 mg via INTRAVENOUS

## 2023-11-18 MED ORDER — HEPARIN SODIUM (PORCINE) 1000 UNIT/ML IJ SOLN
INTRAMUSCULAR | Status: AC
  Filled 2023-11-18: qty 20

## 2023-11-18 MED ORDER — ROCURONIUM BROMIDE 10 MG/ML (PF) SYRINGE
PREFILLED_SYRINGE | INTRAVENOUS | Status: DC | PRN
  Administered 2023-11-18: 20 mg via INTRAVENOUS
  Administered 2023-11-18: 30 mg via INTRAVENOUS
  Administered 2023-11-18: 50 mg via INTRAVENOUS

## 2023-11-18 MED ORDER — SODIUM CHLORIDE 0.9 % IV SOLN: 250.0000 mL | INTRAVENOUS | Status: DC | PRN

## 2023-11-18 MED ORDER — APIXABAN 5 MG PO TABS
5.0000 mg | ORAL_TABLET | Freq: Two times a day (BID) | ORAL | Status: DC
  Administered 2023-11-18: 5 mg via ORAL
  Filled 2023-11-18: qty 1

## 2023-11-18 MED ORDER — SODIUM CHLORIDE 0.9% FLUSH: 3.0000 mL | INTRAVENOUS | Status: DC | PRN

## 2023-11-18 MED ORDER — SUGAMMADEX SODIUM 200 MG/2ML IV SOLN
INTRAVENOUS | Status: DC | PRN
  Administered 2023-11-18: 200 mg via INTRAVENOUS
  Administered 2023-11-18: 100 mg via INTRAVENOUS

## 2023-11-18 MED ORDER — PROTAMINE SULFATE 10 MG/ML IV SOLN
INTRAVENOUS | Status: DC | PRN
  Administered 2023-11-18: 35 mg via INTRAVENOUS

## 2023-11-18 MED ORDER — ATROPINE SULFATE 1 MG/10ML IJ SOSY
PREFILLED_SYRINGE | INTRAMUSCULAR | Status: AC
Start: 2023-11-18 — End: ?
  Filled 2023-11-18: qty 10

## 2023-11-18 MED ORDER — FENTANYL CITRATE (PF) 100 MCG/2ML IJ SOLN
INTRAMUSCULAR | Status: AC
  Filled 2023-11-18: qty 2

## 2023-11-18 SURGICAL SUPPLY — 20 items
CABLE PFA RX CATH CONN (CABLE) IMPLANT
CATH 8FR REPROCESSED SOUNDSTAR (CATHETERS) ×1 IMPLANT
CATH 8FR SOUNDSTAR REPROCESSED (CATHETERS) IMPLANT
CATH BI DIR 7FR CS F-J 12 PIN (CATHETERS) IMPLANT
CATH FARAWAVE ABLATION 31 (CATHETERS) IMPLANT
CATH OCTARAY 2.0 F 3-3-3-3-3 (CATHETERS) IMPLANT
CLOSURE PERCLOSE PROSTYLE (VASCULAR PRODUCTS) IMPLANT
COVER SWIFTLINK CONNECTOR (BAG) ×1 IMPLANT
DEVICE CLOSURE MYNXGRIP 6/7F (Vascular Products) IMPLANT
DILATOR VESSEL 38 20CM 16FR (INTRODUCER) IMPLANT
GUIDEWIRE INQWIRE 1.5J.035X260 (WIRE) IMPLANT
INQWIRE 1.5J .035X260CM (WIRE) ×1 IMPLANT
KIT VERSACROSS CNCT FARADRIVE (KITS) IMPLANT
MAT PREVALON FULL STRYKER (MISCELLANEOUS) IMPLANT
PACK EP LF (CUSTOM PROCEDURE TRAY) ×1 IMPLANT
PAD DEFIB RADIO PHYSIO CONN (PAD) ×1 IMPLANT
SHEATH FARADRIVE STEERABLE (SHEATH) IMPLANT
SHEATH PINNACLE 8F 10CM (SHEATH) IMPLANT
SHEATH PINNACLE 9F 10CM (SHEATH) IMPLANT
SHEATH PROBE COVER 6X72 (BAG) IMPLANT

## 2023-11-18 NOTE — Anesthesia Procedure Notes (Signed)
 Procedure Name: Intubation Date/Time: 11/18/2023 10:39 AM  Performed by: Alwyn Ren, CRNAPre-anesthesia Checklist: Patient identified, Emergency Drugs available, Suction available and Patient being monitored Patient Re-evaluated:Patient Re-evaluated prior to induction Oxygen Delivery Method: Circle system utilized Preoxygenation: Pre-oxygenation with 100% oxygen Induction Type: IV induction Ventilation: Mask ventilation without difficulty Laryngoscope Size: Miller and 3 Grade View: Grade I Tube type: Oral Tube size: 7.5 mm Number of attempts: 1 Airway Equipment and Method: Stylet and Oral airway Placement Confirmation: ETT inserted through vocal cords under direct vision, positive ETCO2 and breath sounds checked- equal and bilateral Secured at: 24 cm Tube secured with: Tape Dental Injury: Teeth and Oropharynx as per pre-operative assessment

## 2023-11-18 NOTE — Transfer of Care (Signed)
 Immediate Anesthesia Transfer of Care Note  Patient: Ronald Davis  Procedure(s) Performed: ATRIAL FIBRILLATION ABLATION  Patient Location: PACU and Cath Lab  Anesthesia Type:General  Level of Consciousness: awake, alert , and oriented  Airway & Oxygen Therapy: Patient Spontanous Breathing and Patient connected to nasal cannula oxygen  Post-op Assessment: Report given to RN and Post -op Vital signs reviewed and stable  Post vital signs: Reviewed and stable  Last Vitals:  Vitals Value Taken Time  BP    Temp    Pulse 58 11/18/23 1247  Resp 16 11/18/23 1247  SpO2 95 % 11/18/23 1247  Vitals shown include unfiled device data.  Last Pain:  Vitals:   11/18/23 0833  TempSrc: Oral  PainSc:          Complications: There were no known notable events for this encounter.

## 2023-11-18 NOTE — Progress Notes (Signed)
  Pt arrived from EP via Bed to HA19. Report received from RN and CRNA. Pt vitals are stable, performed q42min see vitals flowsheet. Anesthesia recovery has been uneventful. Pt to transition to short stay for remainder of recovery. Will continue to monitor patient while under holding area care.

## 2023-11-18 NOTE — Anesthesia Postprocedure Evaluation (Signed)
 Anesthesia Post Note  Patient: Ronald Davis  Procedure(s) Performed: ATRIAL FIBRILLATION ABLATION     Patient location during evaluation: Cath Lab Anesthesia Type: General Level of consciousness: awake and alert Pain management: pain level controlled Vital Signs Assessment: post-procedure vital signs reviewed and stable Respiratory status: spontaneous breathing, nonlabored ventilation, respiratory function stable and patient connected to nasal cannula oxygen Cardiovascular status: blood pressure returned to baseline and stable Postop Assessment: no apparent nausea or vomiting Anesthetic complications: no  There were no known notable events for this encounter.  Last Vitals:  Vitals:   11/18/23 1315 11/18/23 1320  BP: 119/67 119/68  Pulse: (!) 57 (!) 56  Resp: 20 15  Temp:    SpO2: 94% 95%    Last Pain:  Vitals:   11/18/23 1248  TempSrc: Oral  PainSc: 0-No pain                 Maksymilian Mabey,W. EDMOND

## 2023-11-18 NOTE — Discharge Instructions (Signed)

## 2023-11-18 NOTE — Progress Notes (Signed)
 Patient and wife was given discharge instructions. Both verbalized understanding.

## 2023-11-18 NOTE — H&P (Signed)
 Electrophysiology Note:   Date:  11/18/23  ID:  Ronald Davis, DOB October 07, 1950, MRN [id number]   Primary Cardiologist: [name], MD Electrophysiologist: [name], MD       History of Present Illness:   Ronald Davis is a 73 y.o. male with h/o coronary calcification, obstructive sleep apnea s/p tonsillectomy/uvulectomy and rhinoplasty, hyperlipidemia and persistent atrial fibrillation s/p PVI on 09/04/19 who is being seen today for evaluation of his atrial fibrillation at the request of [name], [place].   Discussed the use of AI scribe software for clinical note transcription with the patient, who gave verbal consent to proceed.   History of Present Illness   The patient, a 73 year old with a history of atrial fibrillation (AFib), presents with a recurrence of AFib symptoms following an ablation procedure in 2021. The patient reports a longer than expected recovery period after his first ablation of eight months, during which he experienced intermittent heart palpitations and fatigue, which he attributes to atrial fibrillation. The patient also noticed that alcohol consumption, specifically wine, would trigger AFib symptoms 24 hours later. 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 He presented for a follow up visit on 12/13 with cardiology and was found to be in atrial fibrillation. He underwent DCCV on 12/27. He had follow up with AF Clinic and was back in atrial fibrillation. He has been started on amiodarone and metoprolol was discontinued due to a consistently low heart rate, which was concerning to the patient. The patient is considering another ablation procedure to better manage the AFib and be able to come off amiodarone. No new or acute complaints today.   Interval: Patient presents today for scheduled ablation. Reports feeling relatively well. Recently took fishing  trip. No new or acute complaints.     Review of systems complete and found to be negative unless listed in HPI.    EP Information / Studies Reviewed:     EKG Interpretation Date/Time:                  Friday October 14 2023 16:32:19 EST Ventricular Rate:         53 PR Interval:                 196 QRS Duration:             94 QT Interval:                 476 QTC Calculation:446 R Axis:                         39   Text Interpretation:Sinus bradycardia When compared with ECG of 26-Sep-2023 09:44, PR interval has decreased and heart rate increased by 10bpm. Confirmed by [name] [phone]) on 10/15/2023 11:33:31 AM    Echo 07/24/19:  1. Left ventricular ejection fraction, by visual estimation, is 60 to  65%. The left ventricle has normal function. There is no left ventricular  hypertrophy.   2. Left ventricular diastolic function could not be evaluated.   3. Global right ventricle has normal systolic function.The right  ventricular size is normal. No increase in right ventricular wall  thickness.   4. Left atrial size was normal.   5. Right atrial size was normal.   6. The mitral valve is normal in structure. Mild mitral valve  regurgitation. No  evidence of mitral stenosis.   7. The tricuspid valve is normal in structure. Tricuspid valve  regurgitation is mild.   8. The aortic valve is normal in structure. Aortic valve regurgitation is  not visualized. Mild to moderate aortic valve sclerosis/calcification  without any evidence of aortic stenosis.   9. The pulmonic valve was normal in structure. Pulmonic valve  regurgitation is not visualized.  10. Normal pulmonary artery systolic pressure.  11. The inferior vena cava is normal in size with greater than 50%  respiratory variability, suggesting right atrial pressure of 3 mmHg.      Risk Assessment/Calculations:     CHA2DS2-VASc Score = 2   This indicates a 2.2% annual risk of stroke. The patient's score is based upon: CHF  History: 0 HTN History: 0 Diabetes History: 0 Stroke History: 0 Vascular Disease History: 1 Age Score: 1 Gender Score: 0           Physical Exam:    Vitals:   11/18/23 0833  BP: 139/67  Pulse: (!) 50  Temp: 98.1 F (36.7 C)  SpO2: 96%   GEN: Well nourished, well developed in no acute distress NECK: No JVD CARDIAC: Bradycardic, regular RESPIRATORY:  Clear to auscultation without rales, wheezing or rhonchi  ABDOMEN: Soft, non-distended EXTREMITIES:  No edema; No deformity    ASSESSMENT AND PLAN:   Assessment and Plan    # Symptomatic persistent atrial fibrillation: Recurrent AFib post-ablation in 2021. Notably, during ablation Dr. Johney Frame was unable to isolate RSPV. Posterior wall isolation was not performed.  #. Secondary hypercoagulable state due to atrial fibrillation: CHADSVASC score of 2. -Discussed treatment options today for AF including antiarrhythmic drug therapy and ablation. Discussed risks, recovery and likelihood of success with each treatment strategy. Risk, benefits, and alternatives to EP study and ablation for afib were discussed. These risks include but are not limited to stroke, bleeding, vascular damage, tamponade, perforation, damage to the esophagus, lungs, phrenic nerve and other structures, pulmonary vein stenosis, worsening renal function, coronary vasospasm and death.  Discussed potential need for repeat ablation procedures and antiarrhythmic drugs after an initial ablation. The patient understands these risk and wishes to proceed today. -Continue amiodarone until 1 month post ablation. -Continue Eliquis.   Follow up with Dr. Jimmey Ralph  3 months after ablation.    Signed, [name], MD

## 2023-11-18 NOTE — Anesthesia Preprocedure Evaluation (Addendum)
 Anesthesia Evaluation  Patient identified by MRN, date of birth, ID band Patient awake    Reviewed: Allergy & Precautions, NPO status , Patient's Chart, lab work & pertinent test results  Airway Mallampati: I  TM Distance: >3 FB Neck ROM: Full    Dental  (+) Chipped, Dental Advisory Given,    Pulmonary sleep apnea    Pulmonary exam normal breath sounds clear to auscultation       Cardiovascular + CAD  Normal cardiovascular exam+ dysrhythmias (eliquis) Atrial Fibrillation  Rhythm:Regular Rate:Normal  TTE 2020 1. Left ventricular ejection fraction, by visual estimation, is 60 to  65%. The left ventricle has normal function. There is no left ventricular  hypertrophy.   2. Left ventricular diastolic function could not be evaluated.   3. Global right ventricle has normal systolic function.The right  ventricular size is normal. No increase in right ventricular wall  thickness.   4. Left atrial size was normal.   5. Right atrial size was normal.   6. The mitral valve is normal in structure. Mild mitral valve  regurgitation. No evidence of mitral stenosis.   7. The tricuspid valve is normal in structure. Tricuspid valve  regurgitation is mild.   8. The aortic valve is normal in structure. Aortic valve regurgitation is  not visualized. Mild to moderate aortic valve sclerosis/calcification  without any evidence of aortic stenosis.   9. The pulmonic valve was normal in structure. Pulmonic valve  regurgitation is not visualized.  10. Normal pulmonary artery systolic pressure.  11. The inferior vena cava is normal in size with greater than 50%  respiratory variability, suggesting right atrial pressure of 3 mmHg.     Neuro/Psych negative neurological ROS  negative psych ROS   GI/Hepatic negative GI ROS, Neg liver ROS,,,  Endo/Other  negative endocrine ROS    Renal/GU negative Renal ROS  negative genitourinary    Musculoskeletal negative musculoskeletal ROS (+)    Abdominal   Peds  Hematology negative hematology ROS (+)   Anesthesia Other Findings   Reproductive/Obstetrics                             Anesthesia Physical Anesthesia Plan  ASA: 3  Anesthesia Plan: General   Post-op Pain Management: Tylenol PO (pre-op)*   Induction: Intravenous  PONV Risk Score and Plan: 2 and Dexamethasone, Ondansetron and Treatment may vary due to age or medical condition  Airway Management Planned: Oral ETT  Additional Equipment:   Intra-op Plan:   Post-operative Plan: Extubation in OR  Informed Consent: I have reviewed the patients History and Physical, chart, labs and discussed the procedure including the risks, benefits and alternatives for the proposed anesthesia with the patient or authorized representative who has indicated his/her understanding and acceptance.     Dental advisory given  Plan Discussed with: CRNA  Anesthesia Plan Comments:        Anesthesia Quick Evaluation

## 2023-11-21 ENCOUNTER — Telehealth (HOSPITAL_COMMUNITY): Payer: Self-pay

## 2023-11-21 MED FILL — Atropine Sulfate Soln Prefill Syr 1 MG/10ML (0.1 MG/ML): INTRAMUSCULAR | Qty: 10 | Status: AC

## 2023-11-21 MED FILL — Fentanyl Citrate Preservative Free (PF) Inj 100 MCG/2ML: INTRAMUSCULAR | Qty: 2 | Status: AC

## 2023-11-21 NOTE — Telephone Encounter (Signed)
 Spoke with patient to complete post procedure follow up call.  Patient removed large bandage at puncture site after 24 hours and reports no complications with groin sites.   Instructions reviewed with patient:  It is normal to have bruising, tenderness and a pea or marble sized lump/knot at the groin site which can take up to three months to resolve.  Get help right away if you notice sudden swelling at the puncture site.  Check your puncture site every day for signs of infection: fever, redness, swelling, pus drainage, warmth, foul odor or excessive pain. If this occurs, please call the office at [phone], to speak with the nurse. Get help right away if your puncture site is bleeding and the bleeding does not stop after applying firm pressure to the area.  You may continue to have skipped beats/ atrial fibrillation during the first several months after your procedure.  It is very important not to miss any doses of your blood thinner Eliquis. Patient restarted taking this medication on 11/18/23.   You will follow up with the Afib clinic on 12/16/23 and follow up with the APP on 02/20/24.   Patient verbalized understanding to all instructions provided.

## 2023-12-01 ENCOUNTER — Encounter: Payer: Self-pay | Admitting: Emergency Medicine

## 2023-12-16 ENCOUNTER — Encounter (HOSPITAL_COMMUNITY): Payer: Self-pay | Admitting: Physician Assistant

## 2023-12-16 ENCOUNTER — Ambulatory Visit (HOSPITAL_COMMUNITY)
Admission: RE | Admit: 2023-12-16 | Discharge: 2023-12-16 | Disposition: A | Discharge status: Home / Self Care | Source: Ambulatory Visit | Attending: Physician Assistant | Admitting: Physician Assistant

## 2023-12-16 VITALS — BP 136/72 | HR 61 | Ht 73.0 in | Wt 248.4 lb

## 2023-12-16 DIAGNOSIS — D6869 Other thrombophilia: Secondary | ICD-10-CM | POA: Diagnosis not present

## 2023-12-16 DIAGNOSIS — I4819 Other persistent atrial fibrillation: Secondary | ICD-10-CM

## 2023-12-16 DIAGNOSIS — Z79899 Other long term (current) drug therapy: Secondary | ICD-10-CM

## 2023-12-16 NOTE — Progress Notes (Signed)
 Primary Care Physician: Lory Rough., PA-C Primary Cardiologist: Dr Rolm Clos Primary Electrophysiologist: Dr Daneil Dunker Referring Physician: Ervin Heath PA   Ronald Davis is a 73 y.o. male with a history of coronary calcification, obstructive sleep apnea s/p tonsillectomy/uvulectomy and rhinoplasty, hyperlipidemia and persistent atrial fibrillation who presents for consultation in the [facility].  The patient was initially diagnosed with atrial fibrillation in October 2020 by his primary care provider who later referred him to [facility] cardiovascular.  Dr. Filiberto Hug started him on Eliquis  for stroke prevention and Cardizem .  It was recommended for him to proceed with cardioversion, he later saw Dr. Rolm Clos on 07/03/2019 for second opinion.  His symptom has improved after starting on the diltiazem .  Initially Dr. Rolm Clos recommended him to proceed with cardioversion recommended by Dr. Filiberto Hug, however later patient establish wished to stay with Dr. Rolm Clos. He underwent TEE/DCCV on 07/20/2019. Unfortunately, patient was back in afib with symptoms of dizziness, exercise intolerance, and generalized weakness. He is unsure when he converted. He does have a history of alcohol use but has not had any drinks recently.   On follow up, patient is s/p afib ablation on 09/04/19. Patient reports that he did have two episodes of heart racing post ablation which lasted about 15 minutes each and then resolved. He denies CP, swallowing or groin issues.  On follow up 08/29/23, he is currently in Afib. S/p successful DCCV on 08/12/23 after being seen by Cardiology on 12/13 and noted to be in Afib with RVR. He has not missed any doses of Eliquis  5 mg BID. He takes Lopressor  25 mg BID.   On follow up 09/19/23, he is currently in Afib. He began amiodarone  load at last visit and has upcoming appointment with EP to discuss repeat ablation. No missed doses of Eliquis . S/p DCCV on 09/26/23. He was seen  by Dr Daneil Dunker and underwent afib ablation on 11/18/23.  Follow up 12/16/23. Patient returns for follow up for atrial fibrillation. He reports that he has done well since the ablation. His wife states that "he is a different person". He has much more energy. He denies chest pain or groin issues.   Today, he  denies symptoms of palpitations, chest pain, shortness of breath, orthopnea, PND, lower extremity edema, dizziness, presyncope, syncope, bleeding, or neurologic sequela. The patient is tolerating medications without difficulties and is otherwise without complaint today.    Atrial Fibrillation Risk Factors:  he does have symptoms or diagnosis of sleep apnea. he does not have a history of rheumatic fever. he does have a history of alcohol use. The patient does have a history of early familial atrial fibrillation or other arrhythmias. Father and brother have afib.   Atrial Fibrillation Management history:  Previous antiarrhythmic drugs: amiodarone  Previous cardioversions: 07/20/19, 08/12/23, 09/26/23 Previous ablations: 09/04/19, 11/18/23 Anticoagulation history: Eliquis    Past Medical History:  Diagnosis Date   Allergy    Cataract    Coronary artery disease    Hyperlipidemia    Low BP    Paroxysmal atrial fibrillation (HCC)    Sleep apnea    no longer as issue per pt 04-15-21    Current Outpatient Medications  Medication Sig Dispense Refill   acetaminophen  (TYLENOL ) 500 MG tablet Take 1,000 mg by mouth every 8 (eight) hours as needed for headache.     amiodarone  (PACERONE ) 200 MG tablet Take 1 tablet (200 mg total) by mouth daily. 30 tablet 3   apixaban  (ELIQUIS ) 5 MG TABS tablet  Take 1 tablet (5 mg total) by mouth 2 (two) times daily. 14 tablet    halobetasol (ULTRAVATE) 0.05 % cream Apply 1 application  topically daily as needed (on hands).     rosuvastatin  (CRESTOR ) 20 MG tablet TAKE 1 TABLET BY MOUTH EVERYDAY AT BEDTIME 90 tablet 3   tamsulosin (FLOMAX) 0.4 MG CAPS capsule Take  0.8 mg by mouth daily.     traZODone  (DESYREL ) 50 MG tablet TAKE 1 TABLET BY MOUTH EVERYDAY AT BEDTIME 90 tablet 3   No current facility-administered medications for this encounter.     ROS- All systems are reviewed and negative except as per the HPI above.  Physical Exam: Vitals:   12/16/23 0849  BP: 136/72  Pulse: 61  Weight: 112.7 kg  Height: 6\' 1"  (1.854 m)    GEN: Well nourished, well developed in no acute distress CARDIAC: Regular rate and rhythm, no murmurs, rubs, gallops RESPIRATORY:  Clear to auscultation without rales, wheezing or rhonchi  ABDOMEN: Soft, non-tender, non-distended EXTREMITIES:  No edema; No deformity    Wt Readings from Last 3 Encounters:  12/16/23 112.7 kg  11/18/23 109.8 kg  10/14/23 112 kg    EKG today demonstrates  SR Vent. rate 61 BPM PR interval 194 ms QRS duration 92 ms QT/QTcB 446/448 ms   Echo 07/24/19 demonstrated  1. Left ventricular ejection fraction, by visual estimation, is 60 to 65%. The left ventricle has normal function. There is no left ventricular hypertrophy.  2. Left ventricular diastolic function could not be evaluated.  3. Global right ventricle has normal systolic function.The right ventricular size is normal. No increase in right ventricular wall thickness.  4. Left atrial size was normal.  5. Right atrial size was normal.  6. The mitral valve is normal in structure. Mild mitral valve regurgitation. No evidence of mitral stenosis.  7. The tricuspid valve is normal in structure. Tricuspid valve regurgitation is mild.  8. The aortic valve is normal in structure. Aortic valve regurgitation is not visualized. Mild to moderate aortic valve sclerosis/calcification without any evidence of aortic stenosis.  9. The pulmonic valve was normal in structure. Pulmonic valve regurgitation is not visualized. 10. Normal pulmonary artery systolic pressure. 11. The inferior vena cava is normal in size with greater than 50% respiratory  variability, suggesting right atrial pressure of 3 mmHg.    Epic records are reviewed at length today   CHA2DS2-VASc Score = 2  The patient's score is based upon: CHF History: 0 HTN History: 0 Diabetes History: 0 Stroke History: 0 Vascular Disease History: 1 Age Score: 1 Gender Score: 0       ASSESSMENT AND PLAN: Persistent Atrial Fibrillation (ICD10:  I48.19) The patient's CHA2DS2-VASc score is 2, indicating a 2.2% annual risk of stroke.   S/p ablation 09/04/19 with Dr Nunzio Belch. Repeat ablation with Dr Daneil Dunker 11/18/23 Patient appears to be maintaining SR Discontinue amiodarone  today Continue Eliquis  5 mg BID with no missed doses for 3 months post ablation.   Secondary Hypercoagulable State (ICD10:  D68.69) The patient is at significant risk for stroke/thromboembolism based upon his CHA2DS2-VASc Score of 2.  Continue Apixaban  (Eliquis ). No bleeding issues.   CAD CAC score 1257 No anginal symptoms Followed by Dr Rolm Clos  [facility]  Not currently on CPAP S/p tonsillectomy/uvulectomy and rhinoplasty    Follow up with Mertha Abrahams as scheduled.     Myrtha Ates PA-C Afib Clinic [facility] [address] [place], [place] [address] [phone] 12/16/2023 8:56 AM

## 2023-12-29 ENCOUNTER — Other Ambulatory Visit: Payer: Self-pay | Admitting: Cardiovascular Disease

## 2023-12-29 DIAGNOSIS — I4891 Unspecified atrial fibrillation: Secondary | ICD-10-CM

## 2023-12-29 NOTE — Telephone Encounter (Signed)
 Prescription refill request for Eliquis  received. Indication: afib  Last office visit:[place] 10/14/2023 Scr: 1.38, 11/04/2023 Age: 73 yo  Weight: 112.7 kg   Refill sent.

## 2024-02-14 NOTE — Progress Notes (Deleted)
 Cardiology Office Note:  .   Date:  02/14/2024  ID:  Ronald Davis, DOB 1950-08-21, MRN [id number] PCP: Ronald Josette MOHR PA-C  [facility] Providers Cardiologist:  Ronald ONEIDA Decent, MD Electrophysiologist:  Ronald Kitty, MD {  History of Present Illness: .   Ronald Davis is a 73 y.o. male w/PMHx of  HTN, HLD obstructive sleep apnea s/p tonsillectomy/uvulectomy and rhinoplasty  Coronary (and aortic) Ca++, neg myoview 2015 AFib   Saw Dr. Kelsie post ablation 2021 > following with his cards team since then > AFib clinic  Referred to RonaldParker saw him Feb 2025, noted that during ablation Dr. Kelsie was unable to isolate RSPV. Posterior wall isolation was not performed. Planned to pursue another ablation.  Ablation on 11/18/23  Saw thew AFib clinic on 12/16/23, feeling much better, no procedural concerns.  No changes were made   Today's visit is scheduled as his post ablation visit  ROS: ***  *** HLD/Ca++ w/cards *** stop amio *** symptoms *** eliquis , dose, bleeding, labs   Arrhythmia/AAD hx AFib ablation 09/04/2019 (Dr. Kelsie) Amiodarone  started Jan 2025 w/ERAF post DCCV (and metoprolol  stopped 2/2 bradycardia that concerned the pt) AFib ablation 11/18/23   Studies Reviewed: SABRA    EKG done today and reviewed by myself:  ***  11/18/23: EPS/ablation CONCLUSIONS: 1. Successful PVI. 2. Successful ablation/isolation of the posterior wall. 3. Intracardiac echo reveals normal LV size and function, trivial pericardial effusion. 4. No early apparent complications. 5. Resume Eliquis  in the recovery area.  6. Stop amiodarone  in 1 month.   11/10/23: Cardiac CT IMPRESSION: 1. There is normal pulmonary vein drainage into the left atrium with ostial measurements above. 2. There is no thrombus in the left atrial appendage. 3. The esophagus runs right of the atrial midline and is in close proximity to the right inferior pulmonary vein ostium. 4. No PFO/ASD. 5. Normal  coronary origin. Right dominance. 6. CAC score of 1257 which is 83rd percentile for age-, race-, and sex-matched controls.   09/04/2019: EPS/ablation CONCLUSIONS: 1. Sinus rhythm upon presentation.   2. Intracardiac echo reveals a moderate sized left atrium with four separate pulmonary veins without evidence of pulmonary vein stenosis. 3. Successful electrical isolation and anatomical encircling of all four pulmonary veins with radiofrequency current.  There was prodigous conduction within the left PVs at baseline.  The right superior PV was very large and difficult to fully isolate. 4. No inducible arrhythmias following ablation both on and off of Isuprel  5. No early apparent complications.  07/24/2019 TTE  1. Left ventricular ejection fraction, by visual estimation, is 60 to  65%. The left ventricle has normal function. There is no left ventricular  hypertrophy.   2. Left ventricular diastolic function could not be evaluated.   3. Global right ventricle has normal systolic function.The right  ventricular size is normal. No increase in right ventricular wall  thickness.   4. Left atrial size was normal.   5. Right atrial size was normal.   6. The mitral valve is normal in structure. Mild mitral valve  regurgitation. No evidence of mitral stenosis.   7. The tricuspid valve is normal in structure. Tricuspid valve  regurgitation is mild.   8. The aortic valve is normal in structure. Aortic valve regurgitation is  not visualized. Mild to moderate aortic valve sclerosis/calcification  without any evidence of aortic stenosis.   9. The pulmonic valve was normal in structure. Pulmonic valve  regurgitation is not visualized.  10. Normal  pulmonary artery systolic pressure.  11. The inferior vena cava is normal in size with greater than 50%  respiratory variability, suggesting right atrial pressure of 3 mmHg.    Risk Assessment/Calculations:    Physical Exam:   VS:  There were no vitals  taken for this visit.   Wt Readings from Last 3 Encounters:  12/16/23 248 lb 6.4 oz (112.7 kg)  11/18/23 242 lb (109.8 kg)  10/14/23 247 lb (112 kg)    GEN: Well nourished, well developed in no acute distress NECK: No JVD; No carotid bruits CARDIAC: ***RRR, no murmurs, rubs, gallops RESPIRATORY:  *** CTA b/l without rales, wheezing or rhonchi  ABDOMEN: Soft, non-tender, non-distended EXTREMITIES: ***  No edema; No deformity   ASSESSMENT AND PLAN: .    persistent AFib CHA2DS2Vasc is 3, on Eliquis , *** appropriately dosed *** burden by symptoms *** stop amiodarone   2.  Coronary and aortic Ca++*** C/w Ronald Davis  3.   HTN  4.   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 stress test, cath, DCCV, TEE, etc)?   Press F2        :[id number]}     Dispo: ***  Signed, Ronald Macario Arthur, PA-C

## 2024-02-15 ENCOUNTER — Other Ambulatory Visit: Payer: Self-pay | Admitting: Cardiovascular Disease

## 2024-02-15 NOTE — Telephone Encounter (Signed)
 Pt of Dr. Glo. Please advise on this Non Cardiac RX.

## 2024-02-20 ENCOUNTER — Ambulatory Visit: Admitting: Physician Assistant

## 2024-02-20 NOTE — Progress Notes (Unsigned)
 Electrophysiology Office Note:   Date:  02/21/2024  ID:  Ronald Davis, DOB 1950/12/25, MRN [id number]  Primary Cardiologist: Ronald ONEIDA Decent, MD Primary Heart Failure: None Electrophysiologist: Ronald Kitty, MD      History of Present Illness:   Ronald Davis is a 73 y.o. male with h/o AF, HTN, HLD, OSA s/p tonsillectomy / uvulectomy / rhinoplasty, coronary and aortic Ca+  seen today for routine electrophysiology follow-up s/p Ablation.  Saw Dr. Kelsie post ablation 2021 > following with his cards team since then > AF Clinic.   Seen by Dr. Kitty Feb 2025, noted that during ablation Dr. Kelsie was unable to isolate RSPV. Posterior wall isolation was not performed. Planned to pursue another ablation. Ablation performed on 11/18/23. Seen in AF Clinic on 12/16/23, feeling much better, no procedural concerns.  No changes were made.  Since last being seen in our clinic the patient reports he feels as if he is 28 again.  No evidence of AF since ablation. Off amiodarone  after one month. Had a [facility] but it is no longer working.  He denies chest pain, palpitations, dyspnea, PND, orthopnea, nausea, vomiting, dizziness, syncope, edema, weight gain, or early satiety.    Review of systems complete and found to be negative unless listed in HPI.   EP Information / Studies Reviewed:    EKG is ordered today. Personal review as below.  EKG Interpretation Date/Time:  Tuesday February 21 2024 15:01:28 EDT Ventricular Rate:  62 PR Interval:  186 QRS Duration:  90 QT Interval:  426 QTC Calculation: 432 R Axis:   22  Text Interpretation: Normal sinus rhythm Normal ECG Confirmed by Aniceto Jarvis ([id number]) on 02/21/2024 3:32:54 PM   Studies:  CT Cardiac Morph / Pulm 10/26/23 > normal PV drainage into LA, no PFO / ASD, normal coronary origin / right dominance, CAC 1257 / 83rd percentile for matched controls EPS 11/18/23 > successful PVI, successful ablation / isolation of the posterior wall   Arrhythmia  / AAD AF Ablation 09/04/2019 (Dr. Kelsie) Amiodarone  > started Jan 2025 w/ERAF post DCCV (and metoprolol  stopped 2/2 bradycardia that concerned the pt) DCCV 07/19/19, 08/12/23, 09/26/23   Risk Assessment/Calculations:    CHA2DS2-VASc Score = 2   This indicates a 2.2% annual risk of stroke. The patient's score is based upon: CHF History: 0 HTN History: 0 Diabetes History: 0 Stroke History: 0 Vascular Disease History: 1 Age Score: 1 Gender Score: 0        STOP-Bang Score:  6       Physical Exam:   VS:  BP 132/74   Pulse 62   Ht 6' 1 (1.854 m)   Wt 247 lb 11.2 oz (112.4 kg)   SpO2 94%   BMI 32.68 kg/m    Wt Readings from Last 3 Encounters:  02/21/24 247 lb 11.2 oz (112.4 kg)  12/16/23 248 lb 6.4 oz (112.7 kg)  11/18/23 242 lb (109.8 kg)     GEN: Well nourished, well developed in no acute distress NECK: No JVD; No carotid bruits CARDIAC: Regular rate and rhythm, no murmurs, rubs, gallops RESPIRATORY:  Clear to auscultation without rales, wheezing or rhonchi  ABDOMEN: Soft, non-tender, non-distended EXTREMITIES:  No edema; No deformity   ASSESSMENT AND PLAN:    Persistent Atrial Fibrillation  CHA2DS2-VASc 2, s/p ablation with redo 11/18/23  -continue OAC for stroke prophylaxis  -stopped amiodarone  one month post ablation   -no AF burden post ablation  -encouraged Kardia Mobile for monitoring of  heart rhythm in future  Secondary Hypercoagulable State  -continue Eliquis  5mg  BID, dose reviewed and appropriate by age / wt   Elevated Coronary Artery Calcium   -no anginal symptoms  -follows with Dr. Barbaraann   Hypertension  -well controlled on current regimen    Follow up with Dr. Kennyth or EP APP in 6 months  Signed, Daphne Barrack, NP-C, AGACNP-BC [facility] - Electrophysiology  02/21/2024, 3:33 PM

## 2024-02-21 ENCOUNTER — Encounter: Payer: Self-pay | Admitting: Pulmonary Disease

## 2024-02-21 ENCOUNTER — Other Ambulatory Visit: Payer: Self-pay | Admitting: *Deleted

## 2024-02-21 ENCOUNTER — Ambulatory Visit: Attending: Pulmonary Disease | Admitting: Pulmonary Disease

## 2024-02-21 VITALS — BP 132/74 | HR 62 | Ht 73.0 in | Wt 247.7 lb

## 2024-02-21 DIAGNOSIS — D6869 Other thrombophilia: Secondary | ICD-10-CM | POA: Diagnosis not present

## 2024-02-21 DIAGNOSIS — I4819 Other persistent atrial fibrillation: Secondary | ICD-10-CM | POA: Diagnosis not present

## 2024-02-21 DIAGNOSIS — I2510 Atherosclerotic heart disease of native coronary artery without angina pectoris: Secondary | ICD-10-CM | POA: Diagnosis not present

## 2024-02-21 MED ORDER — TRAZODONE HCL 50 MG PO TABS: 50.0000 mg | ORAL_TABLET | Freq: Every day | ORAL | 0 refills | Status: DC

## 2024-02-21 NOTE — Telephone Encounter (Addendum)
 Ok per Daphne Barrack NP to refill this pts trazodone  50 mg po daily at bedtime for a 90 day supply, under pts General Cardiologist Dr. Barbaraann.    Per Daphne Barrack NP, Dr. Barbaraann perks in his last note to refill this pts trazodone .   Refill sent to the pts preferred pharmacy [facility] for 90 day supply with zero refills.

## 2024-02-21 NOTE — Patient Instructions (Signed)
 Medication Instructions:  Your physician recommends that you continue on your current medications as directed. Please refer to the Current Medication list given to you today.  *If you need a refill on your cardiac medications before your next appointment, please call your pharmacy*  Lab Work: None ordered If you have labs (blood work) drawn today and your tests are completely normal, you will receive your results only by: MyChart Message (if you have MyChart) OR A paper copy in the mail If you have any lab test that is abnormal or we need to change your treatment, we will call you to review the results.  Follow-Up: At [facility], you and your health needs are our priority.  As part of our continuing mission to provide you with exceptional heart care, our providers are all part of one team.  This team includes your primary Cardiologist (physician) and Advanced Practice Providers or APPs (Physician Assistants and Nurse Practitioners) who all work together to provide you with the care you need, when you need it.  Your next appointment:   6 month(s)  Provider:   You may see Ardeen Kohler, MD or one of the following Advanced Practice Providers on your designated Care Team:   Mertha Abrahams, PA-C Michael Andy Tillery, PA-C Suzann Riddle, NP Creighton Doffing, NP

## 2024-04-08 ENCOUNTER — Encounter: Payer: Self-pay | Admitting: Cardiovascular Disease

## 2024-04-17 NOTE — Telephone Encounter (Signed)
 Left message for patient to call back

## 2024-04-20 NOTE — Progress Notes (Deleted)
 Cardiology Office Note:    Date:  04/20/2024   ID:  Ronald Davis, DOB 1950/12/20, MRN [id number]  PCP:  Debrah Josette MOHR PA-C   [facility] Providers Cardiologist:  Darryle ONEIDA Decent, MD Electrophysiologist:  Fonda Kitty, MD { Click to update primary MD,subspecialty MD or APP then REFRESH:1}    Referring MD: Debrah Josette ORN., PA-C   No chief complaint on file. ***  History of Present Illness:    Ronald Davis is a 73 y.o. male with a hx of ***  Past Medical History:  Diagnosis Date   Allergy    Cataract    Coronary artery disease    Hyperlipidemia    Low BP    Paroxysmal atrial fibrillation (HCC)    Sleep apnea    no longer as issue per pt 04-15-21    Past Surgical History:  Procedure Laterality Date   ATRIAL FIBRILLATION ABLATION N/A 09/04/2019   Procedure: ATRIAL FIBRILLATION ABLATION;  Surgeon: Kelsie Lynwood, MD;  Location: [facility] INVASIVE CV LAB;  Service: Cardiovascular;  Laterality: N/A;   ATRIAL FIBRILLATION ABLATION N/A 11/18/2023   Procedure: ATRIAL FIBRILLATION ABLATION;  Surgeon: Kitty Fonda, MD;  Location[facility] INVASIVE CV LAB;  Service: Cardiovascular;  Laterality: N/A;   CARDIOVERSION N/A 07/20/2019   Procedure: CARDIOVERSION;  Surgeon: Okey Vina GAILS, MD;  Location: [facility] ENDOSCOPY;  Service: Cardiovascular;  Laterality: N/A;   CARDIOVERSION N/A 08/12/2023   Procedure: CARDIOVERSION;  Surgeon: Santo Stanly LABOR, MD;  Location: [facility] INVASIVE CV LAB;  Service: Cardiovascular;  Laterality: N/A;   CARDIOVERSION N/A 09/26/2023   Procedure: CARDIOVERSION;  Surgeon: Kate Lonni CROME, MD;  Location: [facility] INVASIVE CV LAB;  Service: Cardiovascular;  Laterality: N/A;   COLONOSCOPY     ears     ears pinned back   INGUINAL HERNIA REPAIR     right   MOLE REMOVAL     POLYPECTOMY     RHINOPLASTY      for sleep apnea   TEE WITHOUT CARDIOVERSION N/A 07/20/2019   Procedure: TRANSESOPHAGEAL ECHOCARDIOGRAM (TEE);  Surgeon: Okey Vina GAILS, MD;  Location: [facility]  ENDOSCOPY;  Service: Cardiovascular;  Laterality: N/A;   tonisllectomy      Current Medications: No outpatient medications have been marked as taking for the 04/25/24 encounter (Appointment) with Madie Jon Garre, PA.     Allergies:   Naproxen, Amoxicillin, and Tomato   Social History   Socioeconomic History   Marital status: Married    Spouse name: Ronald Davis   Number of children: 0   Years of education: Not on file   Highest education level: Not on file  Occupational History   Not on file  Tobacco Use   Smoking status: Never   Smokeless tobacco: Never   Tobacco comments:    Never smoked 09/19/23  Vaping Use   Vaping status: Never Used  Substance and Sexual Activity   Alcohol use: Yes    Comment: 1 beer a day   Drug use: No   Sexual activity: Not on file  Other Topics Concern   Not on file  Social History Narrative   Psychologist, counselling, retired    Social Drivers of Health   Financial Resource Strain: Low Risk  (06/11/2020)   Received from [facility] visits prior to 10/16/2022.   Overall Financial Resource Strain (CARDIA)    Difficulty of Paying Living Expenses: Not hard at all  Food Insecurity: Low Risk  (02/05/2024)   Received from [facility]  Hunger Vital Sign    Within the past 12 months, you worried that your food would run out before you got money to buy more: Never true    Within the past 12 months, the food you bought just didn't last and you didn't have money to get more. : Never true  Transportation Needs: No Transportation Needs (02/05/2024)   Received from [facility]   Transportation    In the past 12 months, has lack of reliable transportation kept you from medical appointments, meetings, work or from getting things needed for daily living? : No  Physical Activity: Insufficiently Active (02/01/2022)   Received from [facility] visits prior to 10/16/2022., [facility]   Exercise Vital Sign    On average,  how many days per week do you engage in moderate to strenuous exercise (like a brisk walk)?: 3 days    On average, how many minutes do you engage in exercise at this level?: 30 min  Stress: No Stress Concern Present (02/01/2022)   Received from [facility], [facility] visits prior to 10/16/2022.   [employer] of Occupational Health - Occupational Stress Questionnaire    Feeling of Stress : Not at all  Social Connections: Moderately Integrated (02/01/2022)   Received from [facility] visits prior to 10/16/2022., [facility]   Social Connection and Isolation Panel    In a typical week, how many times do you talk on the phone with family, friends, or neighbors?: Three times a week    How often do you get together with friends or relatives?: Twice a week    How often do you attend church or religious services?: Never    Do you belong to any clubs or organizations such as church groups, unions, fraternal or athletic groups, or school groups?: Yes    How often do you attend meetings of the clubs or organizations you belong to?: More than 4 times per year    Are you married, widowed, divorced, separated, never married, or living with a partner?: Married     Family History: The patient's ***family history includes CAD in his father; Colon cancer in his maternal grandmother; Colon polyps in his brother, brother, sister, sister, sister, and sister; Prostate cancer in his father; Stroke in his father and mother. There is no history of Esophageal cancer, Rectal cancer, or Stomach cancer.  ROS:   Please see the history of present illness.    *** All other systems reviewed and are negative.  EKGs/Labs/Other Studies Reviewed:    The following studies were reviewed today: ***      Recent Labs: 07/29/2023: Magnesium 2.3; TSH 2.490 11/04/2023: BUN 15; Creatinine, Ser 1.38; Hemoglobin 16.7; Platelets 181; Potassium 4.4; Sodium 139  Recent Lipid Panel No  results found for: CHOL, TRIG, HDL, CHOLHDL, VLDL, LDLCALC, LDLDIRECT   Risk Assessment/Calculations:   {Does this patient have ATRIAL FIBRILLATION?:[phone]}  No BP recorded.  {Refresh Note OR Click here to enter BP  :1}***    STOP-Bang Score:  6  { Consider Dx Sleep Disordered Breathing or Sleep Apnea  ICD G47.33          :1}     Physical Exam:    VS:  There were no vitals taken for this visit.    Wt Readings from Last 3 Encounters:  02/21/24 247 lb 11.2 oz (112.4 kg)  12/16/23 248 lb 6.4 oz (112.7 kg)  11/18/23 242 lb (109.8 kg)  GEN: *** Well nourished, well developed in no acute distress HEENT: Normal NECK: No JVD; No carotid bruits LYMPHATICS: No lymphadenopathy CARDIAC: ***RRR, no murmurs, rubs, gallops RESPIRATORY:  Clear to auscultation without rales, wheezing or rhonchi  ABDOMEN: Soft, non-tender, non-distended MUSCULOSKELETAL:  No edema; No deformity  SKIN: Warm and dry NEUROLOGIC:  Alert and oriented x 3 PSYCHIATRIC:  Normal affect   ASSESSMENT:    No diagnosis found. PLAN:    In order of problems listed above:  ***      {Are you ordering a CV Procedure (e.g. stress test, cath, DCCV, TEE, etc)?   Press F2        :[id number]}    Medication Adjustments/Labs and Tests Ordered: Current medicines are reviewed at length with the patient today.  Concerns regarding medicines are outlined above.  No orders of the defined types were placed in this encounter.  No orders of the defined types were placed in this encounter.   There are no Patient Instructions on file for this visit.   Signed, Jon Garre Adaira Centola, PA  04/20/2024 9:12 AM    [facility]

## 2024-04-25 ENCOUNTER — Ambulatory Visit: Admitting: Physician Assistant

## 2024-05-20 ENCOUNTER — Other Ambulatory Visit: Payer: Self-pay | Admitting: Cardiovascular Disease

## 2024-05-22 NOTE — Telephone Encounter (Signed)
 Pt's pharmacy is requesting a refill on non cardiac medication trazodone . Would Dr. Barbaraann like to refill this medication? Please address

## 2024-06-19 NOTE — Progress Notes (Unsigned)
 Cardiology Office Note:    Date:  06/21/2024   ID:  Ronald Davis, DOB 1950/08/19, MRN [id number]  PCP:  Debrah Josette MOHR PA-C   [facility] Providers Cardiologist:  Darryle ONEIDA Decent, MD Electrophysiologist:  Fonda Kitty, MD     Referring MD: Debrah Josette ORN., PA-C   Chief Complaint  Patient presents with   Follow-up    HLD    History of Present Illness:    Ronald Davis is a 73 y.o. male with a hx of persistent Afib, HTN, HLD, OSA s/p tonsillectomy/uvulectomy/rhinoplasty, coronary calcifications. He underwent Afib Ablation with Dr. Kelsie, but unable to isolate the RSPV, posterior wall isolation not performed. He has a recent history of DCCV (DCCV 07/2023 --> amiodarone  --> DCCV 09/2022). He underwent repeat ablation 11/18/23 and subsequently felt well. Amiodarone  discontinued one month after ablation.   He presented for general cardiology follow up. No cardiac complaints. He walks dogs 0.25 miles once daily with his dogs. He is committed to starting a walking program. He is planning to walking 30 min daily plus light weights.    Past Medical History:  Diagnosis Date   Allergy    Cataract    Coronary artery disease    Hyperlipidemia    Low BP    Paroxysmal atrial fibrillation (HCC)    Sleep apnea    no longer as issue per pt 04-15-21    Past Surgical History:  Procedure Laterality Date   ATRIAL FIBRILLATION ABLATION N/A 09/04/2019   Procedure: ATRIAL FIBRILLATION ABLATION;  Surgeon: Kelsie Lynwood, MD;  Location: [facility] INVASIVE CV LAB;  Service: Cardiovascular;  Laterality: N/A;   ATRIAL FIBRILLATION ABLATION N/A 11/18/2023   Procedure: ATRIAL FIBRILLATION ABLATION;  Surgeon: Kitty Fonda, MD;  Location: [facility] INVASIVE CV LAB;  Service: Cardiovascular;  Laterality: N/A;   CARDIOVERSION N/A 07/20/2019   Procedure: CARDIOVERSION;  Surgeon: Okey Vina GAILS, MD;  Location: [facility] ENDOSCOPY;  Service: Cardiovascular;  Laterality: N/A;   CARDIOVERSION N/A 08/12/2023    Procedure: CARDIOVERSION;  Surgeon: Santo Stanly LABOR, MD;  Location: [facility] INVASIVE CV LAB;  Service: Cardiovascular;  Laterality: N/A;   CARDIOVERSION N/A 09/26/2023   Procedure: CARDIOVERSION;  Surgeon: Kate Lonni CROME, MD;  Location: [facility] INVASIVE CV LAB;  Service: Cardiovascular;  Laterality: N/A;   COLONOSCOPY     ears     ears pinned back   INGUINAL HERNIA REPAIR     right   MOLE REMOVAL     POLYPECTOMY     RHINOPLASTY      for sleep apnea   TEE WITHOUT CARDIOVERSION N/A 07/20/2019   Procedure: TRANSESOPHAGEAL ECHOCARDIOGRAM (TEE);  Surgeon: Okey Vina GAILS, MD;  Location: [facility] ENDOSCOPY;  Service: Cardiovascular;  Laterality: N/A;   tonisllectomy      Current Medications: Current Meds  Medication Sig   acetaminophen  (TYLENOL ) 500 MG tablet Take 1,000 mg by mouth every 8 (eight) hours as needed for headache.   apixaban  (ELIQUIS ) 5 MG TABS tablet TAKE 1 TABLET BY MOUTH 2 TIMES A DAY   halobetasol (ULTRAVATE) 0.05 % cream Apply 1 application  topically daily as needed (on hands).   tamsulosin (FLOMAX) 0.4 MG CAPS capsule Take 0.8 mg by mouth daily.   traZODone  (DESYREL ) 50 MG tablet TAKE 1 TABLET BY MOUTH EVERYDAY AT BEDTIME   [DISCONTINUED] rosuvastatin  (CRESTOR ) 20 MG tablet TAKE 1 TABLET BY MOUTH EVERYDAY AT BEDTIME     Allergies:   Naproxen, Amoxicillin, and Tomato   Social History   Socioeconomic History  Marital status: Married    Spouse name: Geofm   Number of children: 0   Years of education: Not on file   Highest education level: Not on file  Occupational History   Not on file  Tobacco Use   Smoking status: Never   Smokeless tobacco: Never   Tobacco comments:    Never smoked 09/19/23  Vaping Use   Vaping status: Never Used  Substance and Sexual Activity   Alcohol use: Yes    Comment: 1 beer a day   Drug use: No   Sexual activity: Not on file  Other Topics Concern   Not on file  Social History Narrative   Psychologist, Counselling, retired    Social  Drivers of Health   Financial Resource Strain: Low Risk  (06/11/2020)   Received from [facility] visits prior to 10/16/2022.   Overall Financial Resource Strain (CARDIA)    Difficulty of Paying Living Expenses: Not hard at all  Food Insecurity: Low Risk  (02/05/2024)   Received from [facility]   Hunger Vital Sign    Within the past 12 months, you worried that your food would run out before you got money to buy more: Never true    Within the past 12 months, the food you bought just didn't last and you didn't have money to get more. : Never true  Transportation Needs: No Transportation Needs (02/05/2024)   Received from [employer]    In the past 12 months, has lack of reliable transportation kept you from medical appointments, meetings, work or from getting things needed for daily living? : No  Physical Activity: Insufficiently Active (02/01/2022)   Received from [facility] visits prior to 10/16/2022., [facility]   Exercise Vital Sign    On average, how many days per week do you engage in moderate to strenuous exercise (like a brisk walk)?: 3 days    On average, how many minutes do you engage in exercise at this level?: 30 min  Stress: No Stress Concern Present (02/01/2022)   Received from [facility], [facility] visits prior to 10/16/2022.   [employer] of [employer]ational [employer] - Occupational Stress Questionnaire    Feeling of Stress : Not at all  Social Connections: Moderately Integrated (02/01/2022)   Received from [facility] visits prior to 10/16/2022., [facility]   Social Connection and Isolation Panel    In a typical week, how many times do you talk on the phone with family, friends, or neighbors?: Three times a week    How often do you get together with friends or relatives?: Twice a week    How often do you attend church or religious services?: Never    Do you belong  to any clubs or organizations such as church groups, unions, fraternal or athletic groups, or school groups?: Yes    How often do you attend meetings of the clubs or organizations you belong to?: More than 4 times per year    Are you married, widowed, divorced, separated, never married, or living with a partner?: Married     Family History: The patient's family history includes CAD in his father; Colon cancer in his maternal grandmother; Colon polyps in his brother, brother, sister, sister, sister, and sister; Prostate cancer in his father; Stroke in his father and mother. There is no history of Esophageal cancer, Rectal cancer, or Stomach cancer.  ROS:  Please see the history of present illness.     All other systems reviewed and are negative.  EKGs/Labs/Other Studies Reviewed:    The following studies were reviewed today:       Recent Labs: 07/29/2023: Magnesium 2.3; TSH 2.490 11/04/2023: BUN 15; Creatinine, Ser 1.38; Hemoglobin 16.7; Platelets 181; Potassium 4.4; Sodium 139  Recent Lipid Panel No results found for: CHOL, TRIG, HDL, CHOLHDL, VLDL, LDLCALC, LDLDIRECT   Risk Assessment/Calculations:    CHA2DS2-VASc Score = 2   This indicates a 2.2% annual risk of stroke. The patient's score is based upon: CHF History: 0 HTN History: 0 Diabetes History: 0 Stroke History: 0 Vascular Disease History: 1 Age Score: 1 Gender Score: 0       Physical Exam:    VS:  BP 124/68   Pulse (!) 58   Ht 6' 1 (1.854 m)   Wt 245 lb (111.1 kg)   SpO2 96%   BMI 32.32 kg/m     Wt Readings from Last 3 Encounters:  06/21/24 245 lb (111.1 kg)  02/21/24 247 lb 11.2 oz (112.4 kg)  12/16/23 248 lb 6.4 oz (112.7 kg)     GEN:  Well nourished, well developed in no acute distress HEENT: Normal NECK: No JVD; No carotid bruits LYMPHATICS: No lymphadenopathy CARDIAC: RRR, no murmurs, rubs, gallops RESPIRATORY:  Clear to auscultation without rales, wheezing or rhonchi  ABDOMEN:  Soft, non-tender, non-distended MUSCULOSKELETAL:  No edema; No deformity  SKIN: Warm and dry NEUROLOGIC:  Alert and oriented x 3 PSYCHIATRIC:  Normal affect   ASSESSMENT:    1. Persistent atrial fibrillation (HCC)   2. Chronic anticoagulation   3. Primary hypertension   4. Hyperlipidemia with target LDL less than 70   5. Aortic atherosclerosis   6. Elevated coronary artery calcium  score    PLAN:    In order of problems listed above:  Persistent Afib S/p ablation x 2, most recently 11/2023 Discontinued amiodarone  1 month after ablation - doing well, no recurrence   Chronic anticoagulation Doing well on eliquis  No bleeding issues   Hypertension - on no medications at home per chart - normotensive today   Coronary artery calcification - no chest pain - continue 20 mg crestor    Hyperlipidemia with LDL goal < 55 Lipid panel 10/7972: LDL 54, Trig 209 - he states he was fasting for these labs - started eating red meat again last May - we discussed options to include increasing crestor  to 40 mg vs adding vascepa - he will attempt increasing crestor  to 40 mg first, if side effects will go back to 20 mg and add vascepa     Follow up with general cardiology in 1 year.      Medication Adjustments/Labs and Tests Ordered: Current medicines are reviewed at length with the patient today.  Concerns regarding medicines are outlined above.  No orders of the defined types were placed in this encounter.  Meds ordered this encounter  Medications   rosuvastatin  (CRESTOR ) 20 MG tablet    Sig: Take 2 tablets (40 mg total) by mouth daily.    Dispense:  90 tablet    Refill:  3    Please have Pt make upcoming Appt for future refills ....thanks    Patient Instructions  Medication Instructions:  Increase Crestor  40 mg daily (you can take two of your 20 mg tablets daily) *If you need a refill on your cardiac medications before your next appointment, please call your  pharmacy*  Lab Work:  none If you have labs (blood work) drawn today and your tests are completely normal, you will receive your results only by: MyChart Message (if you have MyChart) OR A paper copy in the mail If you have any lab test that is abnormal or we need to change your treatment, we will call you to review the results.  Testing/Procedures: none  Follow-Up: At [facility], you and your health needs are our priority.  As part of our continuing mission to provide you with exceptional heart care, our providers are all part of one team.  This team includes your primary Cardiologist (physician) and Advanced Practice Providers or APPs (Physician Assistants and Nurse Practitioners) who all work together to provide you with the care you need, when you need it.  Your next appointment:   1 year(s)  Provider:   Darryle ONEIDA Decent, MD              Signed, Jon Garre Evynn Boutelle, PA  06/21/2024 2:04 PM    [facility]

## 2024-06-21 ENCOUNTER — Encounter: Payer: Self-pay | Admitting: Physician Assistant

## 2024-06-21 ENCOUNTER — Ambulatory Visit: Attending: Physician Assistant | Admitting: Physician Assistant

## 2024-06-21 VITALS — BP 124/68 | HR 58 | Ht 73.0 in | Wt 245.0 lb

## 2024-06-21 DIAGNOSIS — I4819 Other persistent atrial fibrillation: Secondary | ICD-10-CM | POA: Diagnosis not present

## 2024-06-21 DIAGNOSIS — I700 Atherosclerosis of aorta: Secondary | ICD-10-CM

## 2024-06-21 DIAGNOSIS — I10 Essential (primary) hypertension: Secondary | ICD-10-CM

## 2024-06-21 DIAGNOSIS — E785 Hyperlipidemia, unspecified: Secondary | ICD-10-CM | POA: Diagnosis not present

## 2024-06-21 DIAGNOSIS — R931 Abnormal findings on diagnostic imaging of heart and coronary circulation: Secondary | ICD-10-CM

## 2024-06-21 DIAGNOSIS — Z7901 Long term (current) use of anticoagulants: Secondary | ICD-10-CM

## 2024-06-21 MED ORDER — ROSUVASTATIN CALCIUM 20 MG PO TABS: 40.0000 mg | ORAL_TABLET | Freq: Every day | ORAL | 3 refills | Status: DC

## 2024-06-21 NOTE — Patient Instructions (Addendum)
 Medication Instructions:  Increase Crestor  40 mg daily (you may take two of your 20 mg tablets daily) If you tolerate well and would prefer to take one 40 mg tablet please let  us  know. *If you need a refill on your cardiac medications before your next appointment, please call your pharmacy*  Lab Work: none If you have labs (blood work) drawn today and your tests are completely normal, you will receive your results only by: MyChart Message (if you have MyChart) OR A paper copy in the mail If you have any lab test that is abnormal or we need to change your treatment, we will call you to review the results.  Testing/Procedures: none  Follow-Up: At [facility], you and your health needs are our priority.  As part of our continuing mission to provide you with exceptional heart care, our providers are all part of one team.  This team includes your primary Cardiologist (physician) and Advanced Practice Providers or APPs (Physician Assistants and Nurse Practitioners) who all work together to provide you with the care you need, when you need it.  Your next appointment:   1 year(s)  Provider:   Darryle ONEIDA Decent, MD

## 2024-07-04 ENCOUNTER — Other Ambulatory Visit: Payer: Self-pay | Admitting: Cardiovascular Disease

## 2024-07-04 DIAGNOSIS — I4891 Unspecified atrial fibrillation: Secondary | ICD-10-CM

## 2024-07-04 NOTE — Telephone Encounter (Signed)
 Prescription refill request for Eliquis  received. Indication:afib Last office visit:11/25 Scr:1.43  6/25 Age: 73 Weight:111.1  kg  Prescription refilled

## 2024-07-07 ENCOUNTER — Other Ambulatory Visit: Payer: Self-pay | Admitting: Cardiovascular Disease

## 2024-07-07 DIAGNOSIS — R931 Abnormal findings on diagnostic imaging of heart and coronary circulation: Secondary | ICD-10-CM

## 2024-07-24 ENCOUNTER — Other Ambulatory Visit: Payer: Self-pay | Admitting: Urology

## 2024-07-24 DIAGNOSIS — R972 Elevated prostate specific antigen [PSA]: Secondary | ICD-10-CM

## 2024-08-02 ENCOUNTER — Encounter (HOSPITAL_BASED_OUTPATIENT_CLINIC_OR_DEPARTMENT_OTHER): Payer: Self-pay

## 2024-08-02 ENCOUNTER — Other Ambulatory Visit: Payer: Self-pay

## 2024-08-02 ENCOUNTER — Emergency Department (HOSPITAL_BASED_OUTPATIENT_CLINIC_OR_DEPARTMENT_OTHER)
Admission: EM | Admit: 2024-08-02 | Discharge: 2024-08-02 | Disposition: A | Discharge status: Home / Self Care | Attending: Emergency Medicine | Admitting: Emergency Medicine

## 2024-08-02 DIAGNOSIS — Z7901 Long term (current) use of anticoagulants: Secondary | ICD-10-CM | POA: Insufficient documentation

## 2024-08-02 DIAGNOSIS — R002 Palpitations: Secondary | ICD-10-CM | POA: Diagnosis present

## 2024-08-02 DIAGNOSIS — I4891 Unspecified atrial fibrillation: Secondary | ICD-10-CM | POA: Diagnosis not present

## 2024-08-02 LAB — CBC WITH DIFFERENTIAL/PLATELET
Abs Immature Granulocytes: 0.04 K/uL (ref 0.00–0.07)
Basophils Absolute: 0.1 K/uL (ref 0.0–0.1)
Basophils Relative: 1 %
Eosinophils Absolute: 0.1 K/uL (ref 0.0–0.5)
Eosinophils Relative: 1 %
HCT: 50.4 % (ref 39.0–52.0)
Hemoglobin: 17 g/dL (ref 13.0–17.0)
Immature Granulocytes: 1 %
Lymphocytes Relative: 16 %
Lymphs Abs: 1.2 K/uL (ref 0.7–4.0)
MCH: 28.2 pg (ref 26.0–34.0)
MCHC: 33.7 g/dL (ref 30.0–36.0)
MCV: 83.7 fL (ref 80.0–100.0)
Monocytes Absolute: 0.6 K/uL (ref 0.1–1.0)
Monocytes Relative: 8 %
Neutro Abs: 5.5 K/uL (ref 1.7–7.7)
Neutrophils Relative %: 73 %
Platelets: 179 K/uL (ref 150–400)
RBC: 6.02 MIL/uL — ABNORMAL HIGH (ref 4.22–5.81)
RDW: 13.7 % (ref 11.5–15.5)
WBC: 7.6 K/uL (ref 4.0–10.5)
nRBC: 0 % (ref 0.0–0.2)

## 2024-08-02 LAB — COMPREHENSIVE METABOLIC PANEL WITH GFR
ALT: 23 U/L (ref 0–44)
AST: 30 U/L (ref 15–41)
Albumin: 4.5 g/dL (ref 3.5–5.0)
Alkaline Phosphatase: 79 U/L (ref 38–126)
Anion gap: 12 (ref 5–15)
BUN: 15 mg/dL (ref 8–23)
CO2: 23 mmol/L (ref 22–32)
Calcium: 10.2 mg/dL (ref 8.9–10.3)
Chloride: 103 mmol/L (ref 98–111)
Creatinine, Ser: 1.36 mg/dL — ABNORMAL HIGH (ref 0.61–1.24)
GFR, Estimated: 54 mL/min — ABNORMAL LOW (ref >60)
Glucose, Bld: 106 mg/dL — ABNORMAL HIGH (ref 70–99)
Potassium: 3.9 mmol/L (ref 3.5–5.1)
Sodium: 139 mmol/L (ref 135–145)
Total Bilirubin: 0.9 mg/dL (ref 0.0–1.2)
Total Protein: 7.1 g/dL (ref 6.5–8.1)

## 2024-08-02 LAB — MAGNESIUM: Magnesium: 2.1 mg/dL (ref 1.7–2.4)

## 2024-08-02 MED ORDER — DILTIAZEM HCL ER COATED BEADS 120 MG PO CP24: 120.0000 mg | ORAL_CAPSULE | Freq: Every day | ORAL | 1 refills | Status: AC

## 2024-08-02 MED ORDER — SODIUM CHLORIDE 0.9 % IV BOLUS
500.0000 mL | Freq: Once | INTRAVENOUS | Status: AC
  Administered 2024-08-02: 17:00:00 500 mL via INTRAVENOUS

## 2024-08-02 MED ORDER — ETOMIDATE 2 MG/ML IV SOLN
10.0000 mg | Freq: Once | INTRAVENOUS | Status: DC
  Filled 2024-08-02: qty 10

## 2024-08-02 MED ORDER — ETOMIDATE 2 MG/ML IV SOLN
INTRAVENOUS | Status: AC | PRN
  Administered 2024-08-02: 17:00:00 10 mg via INTRAVENOUS

## 2024-08-02 NOTE — ED Provider Notes (Addendum)
 [place] EMERGENCY DEPARTMENT AT [facility] Provider Note   CSN: [id number] Arrival date & time: 08/02/24  1605     Patient presents with: No chief complaint on file.   Ronald Davis is a 73 y.o. male.  With a history of atrial fibrillation on Eliquis  who presents to the ED for palpitations.  Patient first experienced sensation of palpitations and a shattering feeling in his chest around 1500 today.  Symptoms have persisted since the onset.  Mild associated shortness of breath.  History of A-fib requiring multiple cardioversions as well as ablation in April.  Is compliant with his Eliquis  which he takes religiously twice daily.  He is followed by Dr. Vernice and Dr. Kennyth with [facility] cardiology   HPI     Prior to Admission medications  Medication Sig Start Date End Date Taking? Authorizing Provider  diltiazem  (CARDIZEM  CD) 120 MG 24 hr capsule Take 1 capsule (120 mg total) by mouth daily. 08/02/24 09/01/24 Yes Pamella Ozell LABOR, DO  acetaminophen  (TYLENOL ) 500 MG tablet Take 1,000 mg by mouth every 8 (eight) hours as needed for headache.    [provider]  ELIQUIS  5 MG TABS tablet TAKE 1 TABLET BY MOUTH 2 TIMES A DAY 07/04/24   O'Neal, Darryle Ned, MD  halobetasol (ULTRAVATE) 0.05 % cream Apply 1 application  topically daily as needed (on hands).    [provider]  rosuvastatin  (CRESTOR ) 20 MG tablet Take 2 tablets (40 mg total) by mouth daily. 07/10/24   [facility], Jon Garre, PA  tamsulosin (FLOMAX) 0.4 MG CAPS capsule Take 0.8 mg by mouth daily.    [provider]  traZODone  (DESYREL ) 50 MG tablet TAKE 1 TABLET BY MOUTH EVERYDAY AT BEDTIME 05/22/24   O'Neal, Darryle Ned, MD    Allergies: Naproxen, Amoxicillin, and Tomato    Review of Systems  Updated Vital Signs BP 126/77   Pulse 60   Temp 98.2 F (36.8 C) (Oral)   Resp 15   SpO2 95%   Physical Exam Vitals and nursing note reviewed.  HENT:     Head: Normocephalic and atraumatic.   Eyes:     Pupils: Pupils are equal, round, and reactive to light.  Cardiovascular:     Rate and Rhythm: Tachycardia present. Rhythm irregular.  Pulmonary:     Effort: Pulmonary effort is normal.     Breath sounds: Normal breath sounds.  Abdominal:     Palpations: Abdomen is soft.     Tenderness: There is no abdominal tenderness.  Musculoskeletal:     Right lower leg: No edema.     Left lower leg: No edema.  Skin:    General: Skin is warm and dry.  Neurological:     Mental Status: He is alert.  Psychiatric:        Mood and Affect: Mood normal.     (all labs ordered are listed, but only abnormal results are displayed) Labs Reviewed  CBC WITH DIFFERENTIAL/PLATELET - Abnormal; Notable for the following components:      Result Value   RBC 6.02 (*)    All other components within normal limits  COMPREHENSIVE METABOLIC PANEL WITH GFR - Abnormal; Notable for the following components:   Glucose, Bld 106 (*)    Creatinine, Ser 1.36 (*)    GFR, Estimated 54 (*)    All other components within normal limits  MAGNESIUM    EKG: EKG Interpretation Date/Time:  Thursday August 02 2024 17:24:51 EST Ventricular Rate:  58 PR Interval:  206 QRS Duration:  92 QT Interval:  355 QTC Calculation: 349 R Axis:   69  Text Interpretation: Sinus rhythm Low voltage, precordial leads Confirmed by Pamella Sharper [phone]) on 08/02/2024 6:01:04 PM  Radiology: No results found.   .Cardioversion  Date/Time: 08/02/2024 5:19 PM  Performed by: Pamella Sharper LABOR, DO Authorized by: Pamella Sharper LABOR, DO   Consent:    Consent obtained:  Verbal and written   Consent given by:  Patient   Risks discussed:  Induced arrhythmia, cutaneous burn, pain and death   Alternatives discussed:  Rate-control medication Universal protocol:    Procedure explained and questions answered to patient or proxy's satisfaction: yes     Immediately prior to procedure a time out was called: yes     Patient identity  confirmed:  Verbally with patient Pre-procedure details:    Cardioversion basis:  Emergent   Rhythm:  Atrial fibrillation   Electrode placement:  Anterior-posterior Patient sedated: Yes. Refer to sedation procedure documentation for details of sedation.  Attempt one:    Cardioversion mode:  Synchronous   Waveform:  Biphasic   Shock (Joules):  200   Shock outcome:  Conversion to normal sinus rhythm Post-procedure details:    Patient status:  Awake   Patient tolerance of procedure:  Tolerated well, no immediate complications .Sedation  Date/Time: 08/02/2024 7:37 PM  Performed by: Pamella Sharper LABOR, DO Authorized by: Pamella Sharper LABOR, DO   Consent:    Consent obtained:  Verbal and written   Consent given by:  Patient   Risks discussed:  Allergic reaction, dysrhythmia, inadequate sedation, respiratory compromise necessitating ventilatory assistance and intubation, prolonged sedation necessitating reversal and prolonged hypoxia resulting in organ damage   Alternatives discussed:  Analgesia without sedation Universal protocol:    Immediately prior to procedure, a time out was called: yes   Indications:    Procedure performed:  Cardioversion Pre-sedation assessment:    Time since last food or drink:  1200   ASA classification: class 2 - patient with mild systemic disease     Mallampati score:  I - soft palate, uvula, fauces, pillars visible   Pre-sedation assessments completed and reviewed: pre-procedure airway patency not reviewed and pre-procedure cardiovascular function not reviewed   A pre-sedation assessment was completed prior to the start of the procedure Procedure details (see MAR for exact dosages):    Sedation:  Etomidate    Intended level of sedation: deep   Total Provider sedation time (minutes):  12 Post-procedure details:   A post-sedation assessment was completed following the completion of the procedure.   Post-sedation assessments completed and reviewed: airway  patency, cardiovascular function, mental status and respiratory function     Patient is stable for discharge or admission: yes     Procedure completion:  Tolerated well, no immediate complications .Critical Care  Performed by: Pamella Sharper LABOR, DO Authorized by: Pamella Sharper LABOR, DO   Critical care provider statement:    Critical care time (minutes):  40   Critical care was necessary to treat or prevent imminent or life-threatening deterioration of the following conditions:  Cardiac failure   Critical care was time spent personally by me on the following activities:  Development of treatment plan with patient or surrogate, discussions with consultants, evaluation of patient's response to treatment, examination of patient, ordering and review of laboratory studies, ordering and review of radiographic studies, ordering and performing treatments and interventions, pulse oximetry, re-evaluation of patient's condition and review of old charts   I  assumed direction of critical care for this patient from another provider in my specialty: no   Comments:     Care discussed with Dr.Skains (cardiology)    Medications Ordered in the ED  etomidate  (AMIDATE ) injection 10 mg (has no administration in time range)  sodium chloride  0.9 % bolus 500 mL (0 mLs Intravenous Stopped 08/02/24 1847)  etomidate  (AMIDATE ) injection (10 mg Intravenous Given 08/02/24 1723)    Clinical Course as of 08/02/24 1940  Thu Aug 02, 2024  1808 Successful cardioversion as documented in procedure under sedation with etomidate .  Return of normal sinus rhythm.  Patient mains hemodynamically stable.  Discussed with Dr. Jeffrie from cardiology.  He agrees with plan for discharge with close follow-up in the A-fib clinic.  Recommend starting Cardizem  120mg  daily [MP]  1939 Patient remains in normal sinus rhythm hemodynamically stable at this time.  Appropriate for discharge with close cardiology follow-up in A-fib clinic [MP]     Clinical Course User Index [MP] Pamella Ozell LABOR, DO                                 Medical Decision Making 73 year old male with history as above presented to the ED for palpitations that started this afternoon.  A-fib with RVR rate initially in the 130s to 140s.  Slightly hypertensive.  No chest pain at this time.  Has required multiple electrocardioversion's for A-fib in the past and underwent ablation back in April.  Followed by [facility] cardiology.  Will plan for electrocardioversion here and reach out to his cardiology team for further management  Amount and/or Complexity of Data Reviewed Labs: ordered.  Risk Prescription drug management.        Final diagnoses:  Atrial fibrillation with rapid ventricular response [facility])    ED Discharge Orders          Ordered    diltiazem  (CARDIZEM  CD) 120 MG 24 hr capsule  Daily        08/02/24 1935    Ambulatory referral to Cardiology       Comments: If you have not heard from the Cardiology office within the next 72 hours please call [phone].   08/02/24 1935               Pamella Ozell LABOR, DO 08/02/24 1939    Pamella Ozell LABOR, DO 08/02/24 1940

## 2024-08-02 NOTE — Discharge Instructions (Signed)
 You were seen in the emergency department for atrial fibrillation We were able to cardiovert (shock) your heart back into a normal rhythm We discussed this with cardiology Cardiology team would like you to follow-up in the A-fib clinic Please call the number listed above and make an appointment to be seen in the A-fib clinic Cardiology team would also like you to start a medication called Cardizem  Please pick up this medication from your pharmacy tomorrow morning and begin taking as directed once daily for atrial fibrillation Return to the emergency room for chest pain trouble breathing very low blood pressures or return of palpitations

## 2024-08-02 NOTE — ED Notes (Signed)
 Wife, Leona Pressly at bedside, will be pt's transportation home.

## 2024-08-02 NOTE — ED Notes (Signed)
 DC paperwork given and verbally understood.

## 2024-08-02 NOTE — ED Triage Notes (Signed)
 Pt c/o palpitations sort of a side-to-side shaking feeling x approx 1.5hrs, states that pulse  is racing out of control. Advises slight SHOB, but that's it.  Advises hx afib, ablation in April, reports minimal issues since.

## 2024-08-02 NOTE — Sedation Documentation (Signed)
 Unable to rate pain during procedure

## 2024-08-02 NOTE — ED Notes (Signed)
 Pt placed on ETCO2 Nasal Cannula for bedside Cardioversion.  Ambu bag/mask and suction was available at [facility].  Pt was placed on 3L [place] sats were 95-97%, ETCO2 was 22-27 throughout procedure.  Pt tolerated well.  See RN's note.

## 2024-08-07 ENCOUNTER — Encounter (HOSPITAL_COMMUNITY): Payer: Self-pay

## 2024-08-07 ENCOUNTER — Other Ambulatory Visit: Payer: Self-pay

## 2024-08-07 ENCOUNTER — Emergency Department (HOSPITAL_COMMUNITY)
Admission: EM | Admit: 2024-08-07 | Discharge: 2024-08-07 | Discharge status: Against Medical Advice | Attending: Emergency Medicine | Admitting: Emergency Medicine

## 2024-08-07 DIAGNOSIS — I10 Essential (primary) hypertension: Secondary | ICD-10-CM | POA: Diagnosis not present

## 2024-08-07 DIAGNOSIS — Z5321 Procedure and treatment not carried out due to patient leaving prior to being seen by health care provider: Secondary | ICD-10-CM | POA: Insufficient documentation

## 2024-08-07 DIAGNOSIS — I4891 Unspecified atrial fibrillation: Secondary | ICD-10-CM | POA: Diagnosis present

## 2024-08-07 LAB — BASIC METABOLIC PANEL WITH GFR
Anion gap: 11 (ref 5–15)
BUN: 14 mg/dL (ref 8–23)
CO2: 21 mmol/L — ABNORMAL LOW (ref 22–32)
Calcium: 9.1 mg/dL (ref 8.9–10.3)
Chloride: 104 mmol/L (ref 98–111)
Creatinine, Ser: 1.57 mg/dL — ABNORMAL HIGH (ref 0.61–1.24)
GFR, Estimated: 46 mL/min — ABNORMAL LOW
Glucose, Bld: 107 mg/dL — ABNORMAL HIGH (ref 70–99)
Potassium: 3.9 mmol/L (ref 3.5–5.1)
Sodium: 135 mmol/L (ref 135–145)

## 2024-08-07 LAB — CBC WITH DIFFERENTIAL/PLATELET
Abs Immature Granulocytes: 0.05 K/uL (ref 0.00–0.07)
Basophils Absolute: 0.1 K/uL (ref 0.0–0.1)
Basophils Relative: 1 %
Eosinophils Absolute: 0 K/uL (ref 0.0–0.5)
Eosinophils Relative: 0 %
HCT: 49.2 % (ref 39.0–52.0)
Hemoglobin: 16.8 g/dL (ref 13.0–17.0)
Immature Granulocytes: 0 %
Lymphocytes Relative: 8 %
Lymphs Abs: 0.9 K/uL (ref 0.7–4.0)
MCH: 29.2 pg (ref 26.0–34.0)
MCHC: 34.1 g/dL (ref 30.0–36.0)
MCV: 85.4 fL (ref 80.0–100.0)
Monocytes Absolute: 0.8 K/uL (ref 0.1–1.0)
Monocytes Relative: 6 %
Neutro Abs: 10.1 K/uL — ABNORMAL HIGH (ref 1.7–7.7)
Neutrophils Relative %: 85 %
Platelets: 174 K/uL (ref 150–400)
RBC: 5.76 MIL/uL (ref 4.22–5.81)
RDW: 14.2 % (ref 11.5–15.5)
WBC: 11.9 K/uL — ABNORMAL HIGH (ref 4.0–10.5)
nRBC: 0 % (ref 0.0–0.2)

## 2024-08-07 LAB — TROPONIN T, HIGH SENSITIVITY
Troponin T High Sensitivity: <15 ng/L (ref 0–19)
Troponin T High Sensitivity: <15 ng/L (ref 0–19)

## 2024-08-07 LAB — MAGNESIUM: Magnesium: 2.1 mg/dL (ref 1.7–2.4)

## 2024-08-07 NOTE — ED Triage Notes (Signed)
 Pt with hx of afib states that he can feel that he is in afib with RVR which began an hour ago.

## 2024-08-07 NOTE — ED Notes (Signed)
 Pt reports leaving ED d/t long wait times, encouraged pt to stay, not willing, reports has apt with cards tomorrow.

## 2024-08-07 NOTE — ED Provider Triage Note (Signed)
 Emergency Medicine Provider Triage Evaluation Note  CASIMIRO LIENHARD , a 73 y.o. male  was evaluated in triage.  Pt complains of concern for being in atrial fibrillation.  He states this is the same sensation he had last week when he had required cardioversion.  No chest pain or shortness of breath.  Denies missing any doses of Eliquis   Review of Systems  Positive: As above Negative: As above  Physical Exam  BP (!) 140/67   Pulse 87   Temp 99 F (37.2 C)   Resp (!) 22   SpO2 95%  Gen:   Awake, no distress   Resp:  Normal effort  MSK:   Moves extremities without difficulty  Other:    Medical Decision Making  Medically screening exam initiated at 4:57 PM.  Appropriate orders placed.  ANTHONIO MIZZELL was informed that the remainder of the evaluation will be completed by another provider, this initial triage assessment does not replace that evaluation, and the importance of remaining in the ED until their evaluation is complete.    Hildegard Loge, PA-C 08/07/24 [phone]

## 2024-08-08 ENCOUNTER — Telehealth: Payer: Self-pay

## 2024-08-08 ENCOUNTER — Ambulatory Visit (HOSPITAL_COMMUNITY)
Admission: RE | Admit: 2024-08-08 | Discharge: 2024-08-08 | Disposition: A | Discharge status: Home / Self Care | Source: Ambulatory Visit | Attending: Internal Medicine | Admitting: Internal Medicine

## 2024-08-08 ENCOUNTER — Encounter (HOSPITAL_COMMUNITY): Payer: Self-pay | Admitting: Internal Medicine

## 2024-08-08 VITALS — BP 130/82 | HR 70 | Ht 73.0 in | Wt 245.6 lb

## 2024-08-08 DIAGNOSIS — D6869 Other thrombophilia: Secondary | ICD-10-CM | POA: Diagnosis not present

## 2024-08-08 DIAGNOSIS — I4819 Other persistent atrial fibrillation: Secondary | ICD-10-CM | POA: Diagnosis not present

## 2024-08-08 MED ORDER — DRONEDARONE HCL 400 MG PO TABS: 400.0000 mg | ORAL_TABLET | Freq: Two times a day (BID) | ORAL | 6 refills | Status: AC

## 2024-08-08 NOTE — Telephone Encounter (Signed)
 Received a call from Mr. Ronald Davis and his wife earlier this evening to discuss his A-fib.  They were in the ER waiting room when they called the on-call physician.  They reported that Mr Davis had experienced some palpitations in his chest (which they describe as a shaking of the chest) and that they were concerned about a recurrence of his A-fib.  His heart rate had been around 90 and his blood pressure was noted to be 140/67 in triage.  Ronald Davis denied any chest pain, shortness of breath or any other symptoms other than the sensation in his chest which signified the recurrence of A-fib.  They asked if there is anything the on-call physician could do to expedite his ER evaluation and asked if there was anything dangerous they should be concerned about.  I let them know that given this data I had a low suspicion that his A-fib recurrence was life-threatening.  I also let him know that I would reach out to the ER physician to expedite their evaluation.

## 2024-08-08 NOTE — Patient Instructions (Signed)
 Start dronedarone  (MULTAQ ) 400 MG table with food twice a day

## 2024-08-08 NOTE — Progress Notes (Signed)
 "   Primary Care Physician: Debrah Josette ORN., PA-C Primary Cardiologist: Dr Barbaraann Primary Electrophysiologist: Dr Kennyth Referring Physician: Scot Ford PA   Ronald Davis is a 74 y.o. male with a history of coronary calcification, obstructive sleep apnea s/p tonsillectomy/uvulectomy and rhinoplasty, hyperlipidemia and persistent atrial fibrillation who presents for consultation in the [facility].  The patient was initially diagnosed with atrial fibrillation in October 2020 by his primary care provider who later referred him to [facility] cardiovascular.  Dr. Elmira started him on Eliquis  for stroke prevention and Cardizem .  It was recommended for him to proceed with cardioversion, he later saw Dr. Barbaraann on 07/03/2019 for second opinion.  His symptom has improved after starting on the diltiazem .  Initially Dr. Barbaraann recommended him to proceed with cardioversion recommended by Dr. Elmira, however later patient establish wished to stay with Dr. Barbaraann. He underwent TEE/DCCV on 07/20/2019. Unfortunately, patient was back in afib with symptoms of dizziness, exercise intolerance, and generalized weakness. He is unsure when he converted. He does have a history of alcohol use but has not had any drinks recently.   On follow up, patient is s/p afib ablation on 09/04/19. Patient reports that he did have two episodes of heart racing post ablation which lasted about 15 minutes each and then resolved. He denies CP, swallowing or groin issues.  On follow up 08/29/23, he is currently in Afib. S/p successful DCCV on 08/12/23 after being seen by Cardiology on 12/13 and noted to be in Afib with RVR. He has not missed any doses of Eliquis  5 mg BID. He takes Lopressor  25 mg BID.   On follow up 09/19/23, he is currently in Afib. He began amiodarone  load at last visit and has upcoming appointment with EP to discuss repeat ablation. No missed doses of Eliquis . S/p DCCV on 09/26/23. He was seen  by Dr Kennyth and underwent afib ablation on 11/18/23.  Follow up 12/16/23. Patient returns for follow up for atrial fibrillation. He reports that he has done well since the ablation. His wife states that he is a different person. He has much more energy. He denies chest pain or groin issues.   Follow-up 08/08/2024.  Patient is s/p DCCV in the ED on 12/18.  He went to the ED again yesterday for A-fib with RVR but left due to extended wait time.  He states yesterday's episode was likely due to dehydration.  No missed doses of Eliquis .  Today, he  denies symptoms of palpitations, chest pain, shortness of breath, orthopnea, PND, lower extremity edema, dizziness, presyncope, syncope, bleeding, or neurologic sequela. The patient is tolerating medications without difficulties and is otherwise without complaint today.    Atrial Fibrillation Risk Factors:  he does have symptoms or diagnosis of sleep apnea. he does not have a history of rheumatic fever. he does have a history of alcohol use. The patient does have a history of early familial atrial fibrillation or other arrhythmias. Father and brother have afib.   Atrial Fibrillation Management history:  Previous antiarrhythmic drugs: amiodarone  Previous cardioversions: 07/20/19, 08/12/23, 09/26/23, 08/02/24 (ED) Previous ablations: 09/04/19, 11/18/23 Anticoagulation history: Eliquis    Past Medical History:  Diagnosis Date   Allergy    Cataract    Coronary artery disease    Hyperlipidemia    Low BP    Paroxysmal atrial fibrillation (HCC)    Sleep apnea    no longer as issue per pt 04-15-21    Current Outpatient Medications  Medication Sig  Dispense Refill   acetaminophen  (TYLENOL ) 500 MG tablet Take 1,000 mg by mouth every 8 (eight) hours as needed for headache.     diltiazem  (CARDIZEM  CD) 120 MG 24 hr capsule Take 1 capsule (120 mg total) by mouth daily. 30 capsule 1   dronedarone  (MULTAQ ) 400 MG tablet Take 1 tablet (400 mg total) by mouth 2  (two) times daily with a meal. 60 tablet 6   ELIQUIS  5 MG TABS tablet TAKE 1 TABLET BY MOUTH 2 TIMES A DAY 60 tablet 5   halobetasol (ULTRAVATE) 0.05 % cream Apply 1 application  topically daily as needed (on hands).     rosuvastatin  (CRESTOR ) 20 MG tablet Take 2 tablets (40 mg total) by mouth daily. 180 tablet 3   tamsulosin (FLOMAX) 0.4 MG CAPS capsule Take 0.8 mg by mouth daily.     traZODone  (DESYREL ) 50 MG tablet TAKE 1 TABLET BY MOUTH EVERYDAY AT BEDTIME 90 tablet 0   No current facility-administered medications for this encounter.     ROS- All systems are reviewed and negative except as per the HPI above.  Physical Exam: Vitals:   08/08/24 0958  BP: 130/82  Pulse: 70  Weight: 111.4 kg  Height: 6' 1 (1.854 m)    GEN- The patient is well appearing, alert and oriented x 3 today.   Neck - no JVD or carotid bruit noted Lungs- Clear to ausculation bilaterally, normal work of breathing Heart- Regular rate and rhythm, no murmurs, rubs or gallops, PMI not laterally displaced Extremities- no clubbing, cyanosis, or edema Skin - no rash or ecchymosis noted   Wt Readings from Last 3 Encounters:  08/08/24 111.4 kg  08/07/24 109.8 kg  06/21/24 111.1 kg    EKG today demonstrates  EKG Interpretation Date/Time:  Wednesday August 08 2024 10:00:34 EST Ventricular Rate:  70 PR Interval:  186 QRS Duration:  96 QT Interval:  382 QTC Calculation: 412 R Axis:   49  Text Interpretation: Normal sinus rhythm Nonspecific T wave abnormality Abnormal ECG When compared with ECG of 07-Aug-2024 17:03, Premature supraventricular complexes are no longer Present Confirmed by Terra Pac ([id number]) on 08/08/2024 10:03:28 AM     Echo 07/24/19 demonstrated  1. Left ventricular ejection fraction, by visual estimation, is 60 to 65%. The left ventricle has normal function. There is no left ventricular hypertrophy.  2. Left ventricular diastolic function could not be evaluated.  3. Global right  ventricle has normal systolic function.The right ventricular size is normal. No increase in right ventricular wall thickness.  4. Left atrial size was normal.  5. Right atrial size was normal.  6. The mitral valve is normal in structure. Mild mitral valve regurgitation. No evidence of mitral stenosis.  7. The tricuspid valve is normal in structure. Tricuspid valve regurgitation is mild.  8. The aortic valve is normal in structure. Aortic valve regurgitation is not visualized. Mild to moderate aortic valve sclerosis/calcification without any evidence of aortic stenosis.  9. The pulmonic valve was normal in structure. Pulmonic valve regurgitation is not visualized. 10. Normal pulmonary artery systolic pressure. 11. The inferior vena cava is normal in size with greater than 50% respiratory variability, suggesting right atrial pressure of 3 mmHg.    Epic records are reviewed at length today   CHA2DS2-VASc Score = 3  The patient's score is based upon: CHF History: 0 HTN History: 0 Diabetes History: 0 Stroke History: 0 Vascular Disease History: 1 Age Score: 2 Gender Score: 0  ASSESSMENT AND PLAN: Persistent Atrial Fibrillation (ICD10:  I48.19) The patient's CHA2DS2-VASc score is 3, indicating a 3.2% annual risk of stroke.   S/p ablation 09/04/19 with Dr Kelsie. Repeat ablation with Dr Kennyth 11/18/23  Patient is currently in NSR.  He is s/p DCCV in the ED on 08/02/2024.  He noted to go out of rhythm yesterday and would like to decrease his A-fib burden.  We discussed rhythm control options and after discussion patient will begin Multaq  400 mg twice daily.  Continue diltiazem  120 mg daily.  Patient will come back next week for repeat ECG.  Secondary Hypercoagulable State (ICD10:  D68.69) The patient is at significant risk for stroke/thromboembolism based upon his CHA2DS2-VASc Score of 3.  Continue Apixaban  (Eliquis ).  No missed doses of Eliquis .  CAD CAC score 1257 Followed by Dr  Barbaraann  [facility]  Not currently on CPAP S/p tonsillectomy/uvulectomy and rhinoplasty     Follow up next week for repeat ECG.    Ronald Heinrich, PA-C [facility] [address] [place], [place] [address] [phone] 08/08/2024 10:12 AM "

## 2024-08-11 ENCOUNTER — Encounter: Payer: Self-pay | Admitting: Cardiovascular Disease

## 2024-08-14 ENCOUNTER — Ambulatory Visit (HOSPITAL_COMMUNITY)
Admission: RE | Admit: 2024-08-14 | Discharge: 2024-08-14 | Disposition: A | Discharge status: Home / Self Care | Source: Ambulatory Visit | Attending: Internal Medicine | Admitting: Internal Medicine

## 2024-08-14 DIAGNOSIS — Z5181 Encounter for therapeutic drug level monitoring: Secondary | ICD-10-CM

## 2024-08-14 DIAGNOSIS — I4819 Other persistent atrial fibrillation: Secondary | ICD-10-CM | POA: Diagnosis not present

## 2024-08-14 DIAGNOSIS — D6869 Other thrombophilia: Secondary | ICD-10-CM

## 2024-08-14 NOTE — Progress Notes (Signed)
 Patient began Multaq  on 12/24. He contacted clinic on 12/27 noting diarrhea with medication. He contacted clinic back on 12/20 noting significant improvement and wished to continue on medication. Patient does note he has had two bad headaches since starting Multaq .    ECG today shows  EKG Interpretation Date/Time:  Tuesday August 14 2024 13:20:59 EST Ventricular Rate:  61 PR Interval:  184 QRS Duration:  90 QT Interval:  422 QTC Calculation: 424 R Axis:   46  Text Interpretation: Normal sinus rhythm Normal ECG When compared with ECG of 08-Aug-2024 10:00, No significant change was found Confirmed by Terra Pac ([id number]) on 08/14/2024 1:22:56 PM     ECG is stable. Continue Multaq  400 mg BID. He is okay continuing medication for now. He would like to speak with Dr. Kennyth about the potential for repeat ablation.

## 2024-10-09 ENCOUNTER — Ambulatory Visit: Admitting: Physician Assistant

## 2024-10-17 ENCOUNTER — Other Ambulatory Visit
